# Patient Record
Sex: Male | Born: 1935 | Race: White | Hispanic: No | Marital: Married | Smoking: Never smoker
Health system: Southern US, Community
[De-identification: ages and names within clinical notes are randomized; demographics above are authoritative.]

## PROBLEM LIST (undated history)

## (undated) DIAGNOSIS — I472 Ventricular tachycardia: Secondary | ICD-10-CM

## (undated) DIAGNOSIS — I441 Atrioventricular block, second degree: Principal | ICD-10-CM

## (undated) DIAGNOSIS — I440 Atrioventricular block, first degree: Secondary | ICD-10-CM

## (undated) DIAGNOSIS — Z950 Presence of cardiac pacemaker: Secondary | ICD-10-CM

## (undated) DIAGNOSIS — J45909 Unspecified asthma, uncomplicated: Secondary | ICD-10-CM

## (undated) HISTORY — DX: Atrioventricular block, second degree: I44.1

## (undated) HISTORY — DX: Ventricular tachycardia: I47.2

## (undated) HISTORY — DX: Unspecified asthma, uncomplicated: J45.909

## (undated) HISTORY — DX: Presence of cardiac pacemaker: Z95.0

## (undated) HISTORY — DX: Atrioventricular block, first degree: I44.0

---

## 1998-08-20 ENCOUNTER — Ambulatory Visit (HOSPITAL_COMMUNITY): Admission: RE | Admit: 1998-08-20 | Discharge: 1998-08-20 | Payer: Self-pay | Admitting: Gastroenterology

## 2002-01-24 ENCOUNTER — Ambulatory Visit (HOSPITAL_COMMUNITY): Admission: RE | Admit: 2002-01-24 | Discharge: 2002-01-24 | Payer: Self-pay | Admitting: Gastroenterology

## 2008-02-28 ENCOUNTER — Inpatient Hospital Stay (HOSPITAL_COMMUNITY): Admission: EM | Admit: 2008-02-28 | Discharge: 2008-03-01 | Payer: Self-pay | Admitting: Emergency Medicine

## 2008-03-26 ENCOUNTER — Ambulatory Visit (HOSPITAL_COMMUNITY): Admission: RE | Admit: 2008-03-26 | Discharge: 2008-03-26 | Payer: Self-pay | Admitting: Gastroenterology

## 2008-04-14 ENCOUNTER — Inpatient Hospital Stay (HOSPITAL_COMMUNITY): Admission: RE | Admit: 2008-04-14 | Discharge: 2008-04-16 | Payer: Self-pay | Admitting: General Surgery

## 2011-04-18 NOTE — H&P (Signed)
[name], [name]              ACCOUNT NO.:  [id number]   MEDICAL RECORD NO.:  [id number]          PATIENT TYPE:  INP   LOCATION:  NA                           FACILITY:  [facility]   PHYSICIAN:  Sharlet Salina T. Hoxworth, M.D.DATE OF BIRTH:  09/30/1931   DATE OF ADMISSION:  04/14/2008  DATE OF DISCHARGE:                              HISTORY & PHYSICAL   CHIEF COMPLAINT:  Abdominal pain, large hiatal hernia.   HISTORY OF PRESENT ILLNESS:  Mr. Brian Barron is a 77 year old male with a  long history of a known large hiatal hernia.  Over the years, this has  bothered him minimally with occasional episodes of epigastric and chest  pain after eating.  However, he presented approximately six weeks ago  with severe epigastric low substernal pain, nausea and vomiting, and was  found to have organoaxial rotation of a giant hiatal hernia with the  majority of the stomach in the chest.  There was gastric obstruction  which was relieved with an NG tube.  The patient was stabilized and we  recommended proceeding with laparoscopic repair of his hernia.  He has  some degree of reflux, as well, and we will plan a Nissen  fundoplication.  He has undergone a preoperative esophageal manometry  showing normal peristalsis.  The patient is now admitted for this  procedure.   PAST MEDICAL HISTORY:  As above plus AVMs seen on colonoscopy.  Other  than that, he does not have any severe medical or surgical problems or  hospitalizations.   MEDICATIONS:  Protonix, aspirin.   ALLERGIES:  None.   SOCIAL HISTORY:  Married.  Drinks occasional alcohol.  He continues to  work part-time. Nonsmoker   FAMILY HISTORY:  Noncontributory.   REVIEW OF SYSTEMS:  GENERAL:  No fever or chills, weight stable.  RESPIRATORY:  No shortness breath, cough, or wheezing.  CARDIAC: No  chest pain, palpitations, or history of heart disease.  ABDOMEN/GI:  As  above.   PHYSICAL EXAMINATION:  VITAL SIGNS:  The patient is afebrile, heart  rate  71, respirations 14, blood pressure to 140/75.  GENERAL:  Well developed, fairly fit appearing male in no acute  distress.  SKIN:  Warm and dry with no rash or infection.  HEENT: No palpable masses or thyromegaly.  Sclerae nonicteric.  Oropharynx clear.  LUNGS:  Clear without wheezing or increased work of breathing.  Minimal  kyphosis.  CARDIAC:  Regular rhythm.  No murmurs.  No edema.  No JVD.  ABDOMEN: Soft, nontender.  No palpable masses.  No organomegaly.  EXTREMITIES: No joint swelling or deformity.  NEUROLOGIC:  Alert and oriented, gait normal.   ASSESSMENT/PLAN:  Large hiatal hernia containing most of the stomach  with previous episode of obstruction.  The patient is admitted for  laparoscopic repair and Nissen fundoplication.      Lorne Skeens. Hoxworth, M.D.  Electronically Signed     BTH/MEDQ  D:  04/14/2008  T:  04/14/2008  Job:  [id number]

## 2011-04-18 NOTE — Discharge Summary (Signed)
NAMEMARTE, CELANI NO.:  [id number]   MEDICAL RECORD NO.:  [id number]          PATIENT TYPE:  INP   LOCATION:  1417                         FACILITY:  [facility]   PHYSICIAN:  Ramiro Harvest, MD    DATE OF BIRTH:  1931/07/03   DATE OF ADMISSION:  02/27/2008  DATE OF DISCHARGE:  03/01/2008                               DISCHARGE SUMMARY   PRIMARY CARE PHYSICIAN:  Dr. Dorothe Pea of [facility] physicians.   GASTROENTEROLOGIST:  Dr. Sherin Quarry.   DISCHARGE DIAGNOSIS:  1. Large paraesophageal hernia and anterothoracic stomach with episode      of gastric outlet obstruction.  2. History of hiatal hernia.  3. Dehydration.  4. Hyperkalemia.  5. First-degree AV block.  6. Status post tooth extraction.  7. Colonic diverticulosis.   DISCHARGE MEDICATIONS:  1. Protonix 40 mg p.o. daily.  2. Amoxicillin 875 mg p.o. b.i.d. take as previously described x3      days.  3. Benadryl 25 mg q.h.s. p.r.n.  4. Aspirin 81 mg p.o. daily.  5. Gas-X as needed.   DISPOSITION AND FOLLOWUP:  The patient will be discharged home a on a  soft mechanical diet.  The patient is to follow up with Dr. Johna Sheriff.  The patient will be called with a follow-up appointment.  The patient is  also to schedule a follow-up appointment with his primary care  physician, Dr. Dorothe Pea in 2 weeks. On follow-up a basic metabolic  profile needs to be checked to follow up on the patient's electrolytes  and also may need to follow up on the patient's first degree AV block.  PCP will likely need to also follow up on the patient's first degree AV  block and will stay away from placing the patient on any beta blockers,  calcium channel blockers or clonidine.   PROCEDURES PERFORMED:  An abdominal ultrasound was performed on February 27, 2008 which showed no acute intra-abdominal findings by ultrasound.  CT of the abdomen was done on February 28, 2008 which showed a little  contrast noted within the distal stomach and small  bowel which is  compatible with some degree of obstruction of the distal stomach at the  diaphragmatic hiatus, very large hiatal hernia, mild bilateral lower  lung ground glass opacities suggestive of mild inflammation or  infection.  No acute intra-abdominal process.  CT of the pelvis.  No  evidence of acute pelvic abnormality, colonic diverticulosis without  evidence of diverticulitis.   CONSULTATIONS:  A GI consult was done. The patient was seen in  consultation by Dr. Evette Cristal of [facility] gastroenterologist on February 28, 2008.  The patient was also seen by Dr. Johna Sheriff of [facility] surgery on  February 28 2008.   BRIEF ADMISSION HISTORY AND PHYSICAL:  Mr. Madole is a 77 year old  gentleman with a history of hiatal hernia who presented to the ED on  March 26 with acute onset of nausea and vomiting after eating lunch. The  patient had had several episodes in the past which he attributed to his  hiatal hernia.  The patient usually states that he had to take  a little  bit of Gas-X after initial symptoms,  sometimes he has to vomit and this  improves his diarrhea.  The patient had a CT scan of the abdomen and  pelvis obtained that showed a hiatal hernia, pyloric sphincter with a  very poor flow of contrast into the small bowel, most likely consistent  with obstruction. The right upper quadrant was normal after placement of  NG tube, 2 liters of dark brown foul-smelling fluid was obtained,  symptoms quickly resolved.  The patient was admitted to the [facility]  hospitalist for __________ treatment. Of note, the patient had had a  recent tooth extraction for which he was started on amoxicillin but was  not able to complete his course secondary to his nausea and vomiting.   PHYSICAL EXAM PER ADMITTING PHYSICIAN:  Blood pressure 133/86,  respirations 20, pulse 89, temperature 98.0, satting 95% on room air.  GENERAL:  The patient was in no acute distress.  NG tube was in place.  HEENT:  Normocephalic, atraumatic.  Pupils equal, round and reactive to  light.  Extraocular movements intact.  Oropharynx was dry clear, no  exudates.  No lesions and decreased skin turgor.  LUNGS:  Clear to auscultation bilaterally, but somewhat distant breath  sounds.  ABDOMEN:  Nontender, nondistended, positive bowel sounds.  However,  prior to admitting physician examining the patient per ED records, the  patient's abdomen was tender.  EXTREMITIES:  No clubbing, cyanosis or edema.  NEUROLOGICAL:  Intact.   ADMISSION LABS:  White count 9.5, hemoglobin 16.4, platelets 167, sodium  146, potassium 4.6, chloride 104, BUN 19, creatinine 1.32, glucose of  145.  LFTs were normal.  Lipase 35 and cardiac enzymes were negative.   EKG showed a junctional rhythm.  CT of the abdomen and pelvis showed a  hiatus hernia with obstruction. Ultrasound within normal limits.   HOSPITAL COURSE:  1. Large paraesophageal hernia and a intrathoracic stomach with      episode of gastric outlet obstruction.  The patient had been placed      on the NG tube with improvement in his clinical symptoms.  The      patient had no more nausea or vomiting.  The patient was made      n.p.o. The patient was started on Protonix 40 mg IV daily. A      gastroenterology consult was obtained.  The patient was seen in      consultation per Dr. Evette Cristal on February 28, 2008. Dr. Evette Cristal reviewed      the CT scan with the radiologist and they felt the patient had a      very large and anterothoracic stomach and a paraesophageal hernia      as opposed to a sliding hernia and that paraesophageal hernia was      mostly in the chest and almost all of the patient's stomach was      above the diaphragm. Also mentioned that the patient did have a      lipoma in the second portion of his duodenum. It was felt that the      patient would not benefit from an upper endoscopy, a surgical      consult was thus obtained.  The patient was seen in  consultation      per Dr. Johna Sheriff of [facility] surgery.  The patient had      symptomatically improved and the NG tube was removed and the      patient was  placed on a full liquid diet.  The patient tolerated      his full liquid diet, had no more nausea or vomiting during the      hospitalization.  It was felt that the patient did need to have a      laparoscopic repair preferably and this will need to be scheduled      electively. It was felt that the patient was in stable and improved      condition to be discharged home and the patient will be called by      Dr. Jamse Mead office for further follow-up and scheduling of      elective repair of his hernia.  The patient will be discharged home      in stable and improved condition.  2. Hyperkalemia.  The patient had come in initially with a normal      potassium during the hospitalization, it was noted that the      patient's potassium had gone up to 5.6.  The patient was given a      dose of Kayexalate with resolution of his hyperkalemia.  The      patient did not have any EKG findings of any peaked T-waves. The      patient was in stable and improved condition to be discharged home.  3. Dehydration. It was noted that the patient was dehydrated. The      patient was placed on IV fluids and the patient was well-hydrated.      The patient will be discharged in stable and improved condition.  4. First-degree AV block.  This was noted per tele strip. An EKG was      also obtained.  The patient did indeed have a first-degree AV block      with a PR interval of 378.  The patient was asymptomatic and at      this will need to be a followed up per PCP. Will stay away from any      beta blockers or clonidine in the future secondary to his history      of first degree AV block.  5. Status post tooth extraction. The patient just had a tooth      extraction prior to his hospitalization. He was sent home on      amoxicillin but secondary to  his nausea and vomiting was unable to      take it during the hospitalization. The patient was maintained on      clindamycin IV for this and will be discharged home to finish up      his course of amoxicillin. The rest of the patient's medical issues      were stable throughout the hospitalization. The patient will be      discharged in stable and improved condition. On the day of      discharge vital signs, temperature 98.1, pulse of 73, blood      pressure 150/84, respiratory rate 20, satting 95% on room air.      BMET:  Sodium 141, potassium 3.9, chloride one 111, bicarb 26, BUN      7, creatinine 0.97, glucose of 96 and a calcium of 8.1.  It was a      pleasure taking care of Mr. Aseem Sessums.      Ramiro Harvest, MD  Electronically Signed     DT/MEDQ  D:  03/01/2008  T:  03/02/2008  Job:  [id number]   cc:   Molly Maduro  Kristeen Mans, M.D.  Fax: [phone]   Tasia Catchings, M.D.  Fax: [phone]   Graylin Shiver, M.D.  Fax: [phone]   Lorne Skeens. Hoxworth, M.D.  [address], [address]  [place]  [place] [address]

## 2011-04-18 NOTE — Consult Note (Signed)
[name], [name]              ACCOUNT NO.:  [id number]   MEDICAL RECORD NO.:  [id number]          PATIENT TYPE:  INP   LOCATION:  1417                         FACILITY:  [facility]   PHYSICIAN:  Sharlet Salina T. Hoxworth, M.D.DATE OF BIRTH:  [birth year]   DATE OF CONSULTATION:  02/28/2008  DATE OF DISCHARGE:                                 CONSULTATION   CHIEF COMPLAINT:  Abdominal pain, hiatal hernia.   HISTORY OF PRESENT ILLNESS:  Brian Barron is a very pleasant 77 year old  male.  I was asked by Dr. Evette Cristal to evaluate the patient due to a large  hiatal hernia and an episode of abdominal pain.  Mr. Dygert is  followed regularly by Dr. Sherin Quarry and has apparently had a hiatal  hernia noted for some time.  He states that over about the past year he  has had some episodes of abdominal and chest pain after eating.  He  describes a pressure like discomfort in his low substernal area and  upper epigastrium after eating.  It is often after he eats more rapidly  or larger amounts.  He will developed this discomfort and mild pain  which is then fairly promptly relieved by belching or taking Gas-X.  This has not been a really significant problem for him up until this  point.  However, on the day prior to admission after eating lunch he  again developed similar symptoms but this time instead of improving  quickly after Gas-X the pain intensified and became very severe with  what he describes as 10/10 pressure and aching pain in his epigastrium.  This was then associated with vomiting which was described as dark,  possibly old bloody type vomitus.  The patient presented to the  emergency room.  An NG tube was placed with a large amount of return and  very prompt resolution of his symptoms.   CT scan has been obtained showing a large hiatal hernia as described  below.   PAST MEDICAL HISTORY:  The patient is followed by Dr. Sherin Quarry for  hiatal hernia and also has had a colonoscopy with apparent  AVMs which  has not been a major problem for him.  Other than this he denies any  significant medical or surgical problems or hospitalizations.   MEDICATIONS:  Aspirin.   ALLERGIES:  None.   SOCIAL HISTORY:  Significant for alcohol, married, continues to work  part-time.   FAMILY HISTORY:  Noncontributory.   REVIEW OF SYSTEMS:  GENERAL:  No fever or chills.  RESPIRATORY:  Denies  shortness of breath, cough, wheezing.  CARDIAC:  Denies chest pain or  palpitations, history of heart disease.  ABDOMEN:  GI as above.   PHYSICAL EXAMINATION:  VITAL SIGNS:  Currently afebrile with vital signs  all within normal limits.  GENERAL:  He is an alert well-developed male in no distress.  SKIN:  Warm and dry.  No rash or infection.  HEENT:  No masses or thyromegaly.  Sclerae nonicteric.  LUNGS:  Clear without wheezing or increased work of breathing.  CARDIAC:  Regular rate and rhythm.  No murmurs.  No edema.  Peripheral  pulses intact.  ABDOMEN:  NG tube is in place.  The abdomen is flat, soft, nontender.  No masses.  EXTREMITIES:  No joint swelling or deformity.  NEUROLOGIC:  Alert, oriented.  Motor and sensory exams normal.   LABORATORY:  White count is 10.4, hemoglobin 14.2, platelets 141.  Electrolytes - BUN and creatinine all normal.  INR 1.0.  LFTs normal.  Troponin and CK/MBs negative.   IMAGING:  Abdominal ultrasound is negative.  CT scan of the abdomen and  pelvis is reviewed.  This shows a very large hiatal hernia with a  majority of the stomach in the chest.  There is a portion of the mid  body of the stomach that is below the diaphragm in the abdominal cavity  but the distal stomach, pylorus and proximal duodenum are above the  diaphragm in the hernia.  There is poor emptying of contrast from the  stomach.   ASSESSMENT AND PLAN:  Large hiatal hernia with an episode of gastric  obstruction.  This appears to have been relieved by placement of NG tube  and the patient now is  stable and asymptomatic.   I believe this hernia needs to be repaired.  He obviously developed some  folding or torsion of his stomach resulting in severe pain and gastric  outlet obstruction and is at risk for continued problems.   The patient's current episode is resolved.  I suspect we will be able to  remove the NG tube and resume his diet.  Unless he has ongoing symptoms  in the hospital in order to have adequate OR time and assistance I would  recommend discharging the patient home if he is tolerating his diet and  we will plan to schedule him for laparoscopic repair of his hiatal  hernia in the near future.  Will follow with you.      Lorne Skeens. Hoxworth, M.D.  Electronically Signed     BTH/MEDQ  D:  02/29/2008  T:  03/01/2008  Job:  [id number]   cc:   Tasia Catchings, M.D.  Fax: [phone]

## 2011-04-18 NOTE — Consult Note (Signed)
NAMESHANE, Brian Barron NO.:  [id number]   MEDICAL RECORD NO.:  [id number]          PATIENT TYPE:  INP   LOCATION:  1417                         FACILITY:  [facility]   PHYSICIAN:  Graylin Shiver, M.D.   DATE OF BIRTH:  1931-09-18   DATE OF CONSULTATION:  02/28/2008  DATE OF DISCHARGE:                                 CONSULTATION   We were asked to see Brian Barron today in consultation for possible  gastric obstruction by Dr. Ramiro Harvest.   HISTORY OF PRESENT ILLNESS:  This is a 77 year old male who is a patient  of Dr. Boyd Kerbs who has a known large hiatal hernia that has  intermittently caused him pain after eating.  It usually happens once  every six months or so.  The patient says that he just takes an Ex-Lax,  waits about an hour, and the pain is gone.  Yesterday, however, after  lunch the familiar pain started again, but intensified and the patient  began to vomit.  When the pain got so bad that he could no longer stand  it he came to the [facility] emergency department, and the pain was  relieved when an NG tube was placed.   PAST MEDICAL HISTORY:  Significant for large hiatal hernia, GI bleed  secondary to AVM's, and history of anemia.  Dr. Sherin Quarry did his last  endoscopy on the patient 2-3 years ago.  His last colonoscopy was also  done approximately 2-3 years ago.  His next colonoscopy is in 10 years.  I have requested those records from his office.   CURRENT MEDICATIONS:  1. An 81 mg aspirin.  2. Gas-X.  3. Benadryl.  4. Amoxicillin.   ALLERGIES:  He has no known drug allergies.   REVIEW OF SYSTEMS:  Significant for weight loss of about 5 pounds over  the past year.   SOCIAL HISTORY:  Negative for tobacco, negative for alcohol.  He  exercises regularly and lives at home with his wife.   PHYSICAL EXAMINATION:  GENERAL:  He is alert and oriented.  His NG tube  is in place.  It is not draining any liquids at this time.  VITAL SIGNS:   Temperature is 98.5, pulse 77, respirations 20, blood  pressure 118/63.  CARDIOVASCULAR:  Has a regular rate and rhythm with no murmurs, rubs or  gallops.  Reportedly he had some arrhythmias this morning.  LUNGS:  Clear to auscultation bilaterally.  ABDOMEN:  Now soft, nontender, nondistended with good bowel sounds.   CURRENT LABORATORIES:  Show a potassium of 5.6.  However, since this lab  was taken the patient has had a Kayexalate enema.  BUN is 18, creatinine  1.1.  Coags show PT 13.8, PTT 24.  White count 10.4, hemoglobin 14.2,  hematocrit 41.6, platelets 141,000.   Abdominal ultrasound done yesterday that was negative.  He had a CT scan  of his abdomen and pelvis done early this morning at approximately 1  a.m. that showed:   1. Some degree of gastric obstruction at the diaphragm.  2. Very large hiatal hernia.  3.  Mild bilateral lower lung inflammation versus infection.  I spoke      with Dr. Dahlia Client of radiology who told me that the patient did have      a very large anterothoraciic stomach and paraesophageal hernia as      opposed to a sliding hiatal hernia, but the paraesophageal hernia      is mostly in the chest and almost all of the patient's stomach is      above the diaphragm.  He also mentioned that he had a big lipoma in      the second portion of his duodenum.   ASSESSMENT:  Dr. Wandalee Ferdinand has seen and examined the patient, collected  a history, and reviewed the chart.  His impression is that this is a 89-  year-old gentleman with a large paraesophageal hernia causing an  intrathoracic stomach causing partial gastric obstruction.   PLAN:  After having detailed conversations with the family it was  determined that an upper endoscopy would probably not be helpful at this  point.  We will call a surgical consult and add Protonix to his current  medications.  I would recommend that he be discharged on Protonix as an  outpatient as well.   Thanks very much for this  consultation.      Stephani Police, PA    ______________________________  Graylin Shiver, M.D.    MLY/MEDQ  D:  02/28/2008  T:  02/29/2008  Job:  [id number]   cc:   Tasia Catchings, M.D.  Fax: [phone]   Graylin Shiver, M.D.  Fax: [phone]

## 2011-04-18 NOTE — Op Note (Signed)
NAMESALOMON, GANSER              ACCOUNT NO.:  [id number]   MEDICAL RECORD NO.:  [id number]          PATIENT TYPE:  AMB   LOCATION:  ENDO                         FACILITY:  [facility]   PHYSICIAN:  Tasia Catchings, M.D.   DATE OF BIRTH:  1931-08-12   DATE OF PROCEDURE:  03/26/2008  DATE OF DISCHARGE:  03/26/2008                               OPERATIVE REPORT   INDICATION:  Mr. Spray has a significant hiatal hernia and needs to  repair and in order to provide him with a fundoplication, we needed to  know whether his esophageal contractions were normal.   FINDINGS:  Upper esophageal body amplitude was 38 mmHg at resting and it  relaxed properly with swallowing and on time.   Lower esophageal sphincter resting pressure was 38.1 mm and it also  relaxed normally with swallowing and on time.   ESOPHAGEAL BODY:  A 20 wet swallows were obtained and the pressures were  measured at various levels.  All of the swallows produced peristaltic  waves.  There were no repetitive retrograde, spontaneous or simultaneous  contractions noted.   FINAL IMPRESSION:  This was a normal esophageal manometry without  evidence of achalasia or diffuse esophageal spasm.  It is my opinion  that it is safe to perform a fundoplication in this patient.      Tasia Catchings, M.D.  Electronically Signed     JW/MEDQ  D:  04/02/2008  T:  04/03/2008  Job:  [id number]   cc:   Lorne Skeens. Hoxworth, M.D.

## 2011-04-18 NOTE — Op Note (Signed)
NAMEELIZEO, RODRIQUES              ACCOUNT NO.:  [id number]   MEDICAL RECORD NO.:  [id number]          PATIENT TYPE:  INP   LOCATION:  NA                           FACILITY:  [facility]   PHYSICIAN:  Sharlet Salina T. Hoxworth, M.D.DATE OF BIRTH:  1931/02/06   DATE OF PROCEDURE:  04/14/2008  DATE OF DISCHARGE:                               OPERATIVE REPORT   PREOPERATIVE DIAGNOSIS:  Giant hiatal hernia and gastroesophageal  reflux.   POSTOPERATIVE DIAGNOSIS:  Giant hiatal hernia and gastroesophageal  reflux.   SURGICAL PROCEDURES:  Laparoscopic repair of giant hiatal hernia with  Nissen fundoplication.   SURGEON:  Lorne Skeens. Hoxworth, M.D.   ASSISTANT:  Thornton Park. Daphine Deutscher, M.D.   ANESTHESIA:  General.   BRIEF HISTORY:  Mr. Cuff is a 77 year old male with a known hiatal  hernia who has had mild episodes of abdominal and chest pain for some  time.  However, he recently presented with severe epigastric and post  sternal chest pain, nausea and vomiting.  He presented and was found to  have a gastric obstruction with a large hiatal hernia with essentially  the entire stomach in the chest with organoaxial rotation and  obstruction.  This was relieved with NG suction.  After further workup  and discussion, we have elected to repair his large hiatal hernia.  We  will plan Nissen fundoplication, as well, due to some degree of reflux.  The nature of the procedure, indications, risks of bleeding, infection,  cardiorespiratory complications, esophageal gastric injury, recurrent  hernia, dysphagia, gas bloat, and others were discussed and understood.  He is now brought to the operating room for this procedure.   DESCRIPTION OF OPERATION:  The patient was brought to the operating room  and placed in the supine position on the operating table and general  endotracheal anesthesia was induced.  The abdomen was widely sterilely  prepped and draped.  PAS were in place.  He received preoperative  antibiotics.  The correct patient and procedure were verified.  Access  was obtained without difficulty with an 11 mm OptiVu trocar in the left  subcostal space and pneumoperitoneum established.  Under direct vision,  an 11 mm trocar was placed in the extreme right upper quadrant, in the  right mid abdomen, just above to the left of the umbilicus for the  camera port.  Through a 5 mm site, a Nathanson retractor was placed and  the left lobe of the liver elevated with excellent exposure of the  stomach and the hiatus.  Finally, a 5 mm trocar was placed in the left  flank.  There was a very large hiatal hernia present, probably 8-10 cm  in diameter, with the majority of the stomach lying in the mediastinum.  The stomach was retracted inferiorly and then the hernia sac was  incised, initially along the right crus and extending up over the  anterior diaphragmatic defect, working back down along the left crus.  The hernia sac was completely stripped down out of the mediastinum.  The  short gastric vessels at the fundus were identified and 2-3 of these  were  divided with clips and harmonic scalpel freeing the fundus and  further exposing the base of the left crus which was dissected and the  stomach freed from this and further hernia sac stripped down  posteriorly.  The vagus nerves were identified, the esophagus was  identified, and all protected.  The hernia sac was brought completely  down out of the mediastinum and the crura were cleared from the base all  the way up anteriorly and completely defined.  At this point, a Penrose  drain was placed posterior to the esophagus at the EG junction.  I was  able to elevate this and the esophagus was further mobilized somewhat  out of the mediastinum with good length until the EG junction lay  without any tension several cm into the abdominal cavity.  At this  point, a crural repair was done beginning posteriorly with 2-0 Ethibond  pledgeted sutures  working anteriorly until the diaphragm was closed  loosely around the esophagus.  This was done with minimal tension.   At this point, a Surgisis hiatal biologic patch was used and introduced  in the abdomen, oriented, and used as an onlay over the crural repair  with the tails up around the esophagus on either side.  This was sutured  to the diaphragm with a number of 3-0 Vicryl sutures on an SH needle.  Further fixation of the biologic patch was then performed with Tisseel  tissue sealant.  This provided a nice broad coverage around the  diaphragmatic repair.  At this point, a mobile portion of the fundus was  identified and brought posterior to the esophagus and a 360 degrees wrap  was planned with a contiguous piece of fundus.  The lighted 56 bougie  dilator was then passed into the stomach without difficulty.  A 360  degrees wrap was performed for about 5 cm around the bougie dilator to  the esophagus.  The dilator was then removed and the wrap was seen to be  nice and floppy.  The abdomen was carefully inspected for hemostasis and  there was no evidence of bleeding or visceral injury.  The Nathanson  retractor was removed, all CO2 evacuated, and trocars removed.  The skin  incisions were closed with staples.  Sponge and needle counts were  correct.  The patient was taken to recovery in good position.      Lorne Skeens. Hoxworth, M.D.  Electronically Signed     BTH/MEDQ  D:  04/14/2008  T:  04/14/2008  Job:  [id number]

## 2011-04-18 NOTE — H&P (Signed)
Brian Barron, Brian Barron              ACCOUNT NO.:  [id number]   MEDICAL RECORD NO.:  [id number]          PATIENT TYPE:  INP   LOCATION:  0102                         [facility]ACILITY[facility]   PHYSICIAN:  Michiel Cowboy, MDDATE OF BIRTH:  1931-10-23   DATE OF ADMISSION:  02/27/2008  DATE OF DISCHARGE:                              HISTORY & PHYSICAL   PRIMARY CARE PHYSICIAN:  Jethro Bastos, M.D.  GI, Dr. Sherin Quarry.   CHIEF COMPLAINT:  Nausea and vomiting.   HISTORY OF PRESENT ILLNESS:  This is a 77 year old gentleman with  history of hiatal hernia who presented to the emergency department on  March 26, with acute-onset of nausea and vomiting since eating lunch.  The patient has had several episodes in the past which he attributes to  hiatal hernia.  He usually states that he has to take a little bit of  Gas-X.  After initial symptoms, sometimes he has to vomit and this  improves his nausea.  The patient had a CT scan of abdomen and pelvis  obtained that showed a hiatal hernia.  The pyloric sphincter with very  poor flow of contrast into small bowel most likely consistent of  obstruction.  Right upper quadrant was normal.  After placement of NG  tube, 2 L of dark brown, foul smelling fluid was obtained.  Symptoms  quickly resolved.  The patient is being admitted by [facility] hospitalists  for observation and treatment.  Of note, the patient has had a recent  tooth extraction for which he was started on amoxicillin, but was not  able to complete his course secondary to his nausea and vomiting.   ALLERGIES:  No known drug allergies.   MEDICATIONS:  Aspirin.   PAST MEDICAL HISTORY:  Hiatal hernia, otherwise the patient is unsure of  any other medical problems that he has.   SOCIAL HISTORY:  Denies every smoking or drinking.  Exercises regularly.  He lives with wife.   FAMILY HISTORY:  Noncontributory.   REVIEW OF SYSTEMS:  Significant for weight loss of over 5 pounds  unintentional.   Significant for no urine output since lunch.  Otherwise,  unremarkable.   PHYSICAL EXAMINATION:  VITAL SIGNS:  Blood pressure 133/86, respirations  20, heart rate 89, temperature 98, saturations 95% on room air.  GENERAL:  The patient is in no acute distress.  NG tube in place.  HEENT:  Head atraumatic.  Somewhat dry mucous membranes with decreased  skin turgor.  LUNGS:  Clear to auscultation bilaterally, but somewhat distant breath  sounds.  ABDOMEN:  Nontender, nondistended.  After review of ER records, he was  previously very tender.  EXTREMITIES:  Lower extremities without any edema.  NEUROLOGIC:  Intact.   LABORATORY DATA AND X-RAY FINDINGS:  White blood cell count 9.5,  hemoglobin 16.4, platelets 167.  Sodium 146, potassium 4.6, chloride  104, BUN 19, creatinine 1.32, glucose 145.  LFTs are normal.  Lipase 35.  Cardiac enzymes negative.   EKG showing a junctional rhythm.  CT of abdomen and pelvis showing  hiatal hernia with obstruction.  Ultrasound within normal limits.  ASSESSMENT/PLAN:  This is a 77 year old gentleman with hiatal hernia and  obstruction.   1. Gastric obstruction.  Will make the patient n.p.o.  Nasogastric      tube to intermittent suction.  Gastroenterology consult in the      morning.  Protonix intravenous for prophylaxis.  Zofran as needed      for nausea.  2. Dehydration.  Will give intravenous fluids.  3. Junctional rhythm.  Will watch telemetry.  4. Prophylaxis with Protonix and SCDs.      Michiel Cowboy, MD  Electronically Signed     AVD/MEDQ  D:  02/28/2008  T:  02/28/2008  Job:  [id number]   cc:   Jethro Bastos, M.D.  Fax: [phone]   Tasia Catchings, M.D.  Fax: [phone]

## 2011-08-28 LAB — COMPREHENSIVE METABOLIC PANEL
ALT: 19
Albumin: 3.1 — ABNORMAL LOW
Alkaline Phosphatase: 80
BUN: 18
BUN: 19
CO2: 30
Calcium: 10
Calcium: 9.2
Creatinine, Ser: 1.1
GFR calc non Af Amer: 53 — ABNORMAL LOW
Glucose, Bld: 145 — ABNORMAL HIGH
Sodium: 142
Total Protein: 5.6 — ABNORMAL LOW

## 2011-08-28 LAB — CBC
HCT: 41.6
HCT: 47.8
Hemoglobin: 14
Hemoglobin: 16.4
MCHC: 34.4
MCHC: 35.1
MCV: 94.4
Platelets: 141 — ABNORMAL LOW
RBC: 4.27
RBC: 5.1
RDW: 13.5
WBC: 7

## 2011-08-28 LAB — BASIC METABOLIC PANEL
CO2: 24
CO2: 26
Calcium: 8.2 — ABNORMAL LOW
Chloride: 107
Chloride: 111
Creatinine, Ser: 0.94
GFR calc Af Amer: >60
GFR calc Af Amer: >60
GFR calc non Af Amer: >60
Glucose, Bld: 96
Potassium: 3.9
Sodium: 137
Sodium: 141

## 2011-08-28 LAB — DIFFERENTIAL
Basophils Absolute: 0
Basophils Relative: 1
Eosinophils Absolute: 0.1
Lymphocytes Relative: 6 — ABNORMAL LOW
Lymphs Abs: 1.8
Monocytes Absolute: 0.8
Monocytes Relative: 8
Neutro Abs: 6.7
Neutro Abs: 9 — ABNORMAL HIGH
Neutrophils Relative %: 71

## 2011-08-28 LAB — LIPASE, BLOOD: Lipase: 35

## 2011-08-28 LAB — APTT: aPTT: 24

## 2011-08-28 LAB — CK TOTAL AND CKMB (NOT AT ARMC): Relative Index: INVALID

## 2013-01-09 ENCOUNTER — Ambulatory Visit (INDEPENDENT_AMBULATORY_CARE_PROVIDER_SITE_OTHER): Payer: Medicare Other | Admitting: Internal Medicine

## 2013-01-09 ENCOUNTER — Encounter: Payer: Self-pay | Admitting: Internal Medicine

## 2013-01-09 VITALS — BP 122/76 | HR 74 | Temp 98.0°F | Ht 70.0 in | Wt 185.0 lb

## 2013-01-09 DIAGNOSIS — J45909 Unspecified asthma, uncomplicated: Secondary | ICD-10-CM

## 2013-01-09 MED ORDER — BUDESONIDE-FORMOTEROL FUMARATE 80-4.5 MCG/ACT IN AERO: 2.0000 | INHALATION_SPRAY | Freq: Two times a day (BID) | RESPIRATORY_TRACT | Status: DC

## 2013-01-09 NOTE — Patient Instructions (Addendum)
Symbicort 80 Take 2 puffs first thing in am and then another 2 puffs about 12 hours later.   Only use your albuterol (proaire) as a rescue medication to be used if you can't catch your breath by resting or doing a relaxed purse lip breathing pattern. The less you use it, the better it will work when you need it.  Goal is less than twice a week  Work on inhaler technique:  relax and gently blow all the way out then take a nice smooth deep breath back in, triggering the inhaler at same time you start breathing in.  Hold for up to 5 seconds if you can.  Rinse and gargle with water when done   If your mouth or throat starts to bother you,   I suggest you time the inhaler to your dental care and after using the inhaler(s) brush teeth and tongue with a baking soda containing toothpaste and when you rinse this out, gargle with it first to see if this helps your mouth and throat.     Please schedule a follow up office visit in 6 weeks, call sooner if needed with pft's

## 2013-01-09 NOTE — Progress Notes (Signed)
  Subjective:    Patient ID: Brian Barron, male    DOB: 30-Jan-1931  MRN: [id number]  HPI  79 yowm never smoker with dx of asthma off and on since around age 79 eval by Gene Dooms and dx EIA referred 01/10/2013 to Pulmonary clinic by Dr Marrow for sob.     01/09/2013 1st pulmonary cc new onset am sob x one year indolent onset persistent daily sense of sob and mild wheeze each aam better after using albuterol and using avg twice daily= usually at bedtime and usually first thing in am.  Min assoc dry cough.  No obvious daytime variabilty or assoc excess mucous production cough or cp or chest tightness, subjective wheeze overt sinus or hb symptoms. No unusual exp hx or h/o childhood pna/ asthma or premature birth to his knowledge.   After noct saba >Sleeping ok without nocturnal flare  of respiratory  c/o's or need for noct saba. Also denies any obvious fluctuation of symptoms with weather or environmental changes or other aggravating or alleviating factors except as outlined above       Review of Systems  Constitutional: Negative for fever and unexpected weight change.  HENT: Negative for ear pain, nosebleeds, congestion, sore throat, rhinorrhea, sneezing, trouble swallowing, dental problem, postnasal drip and sinus pressure.   Eyes: Negative for redness and itching.  Respiratory: Positive for cough, shortness of breath and wheezing. Negative for chest tightness.   Cardiovascular: Negative for palpitations and leg swelling.  Gastrointestinal: Negative for nausea and vomiting.  Genitourinary: Negative for dysuria.  Musculoskeletal: Negative for joint swelling.  Skin: Negative for rash.  Neurological: Negative for headaches.  Hematological: Does not bruise/bleed easily.  Psychiatric/Behavioral: Positive for dysphoric mood. The patient is nervous/anxious.        Objective:   Physical Exam Wt Readings from Last 3 Encounters:  01/09/13 185 lb (83.915 kg)    HEENT: nl dentition,  turbinates, and orophanx. Nl external ear canals without cough reflex   NECK :  without JVD/Nodes/TM/ nl carotid upstrokes bilaterally   LUNGS: no acc muscle use, clear to A and P bilaterally without cough on insp or exp maneuvers   CV:  RRR  no s3 or murmur or increase in P2, no edema   ABD:  soft and nontender with nl excursion in the supine position. No bruits or organomegaly, bowel sounds nl  MS:  warm without deformities, calf tenderness, cyanosis or clubbing  SKIN: warm and dry without lesions    NEURO:  alert, approp, no deficits         Assessment & Plan:

## 2013-01-10 DIAGNOSIS — J45909 Unspecified asthma, uncomplicated: Secondary | ICD-10-CM | POA: Insufficient documentation

## 2013-01-10 NOTE — Assessment & Plan Note (Addendum)
Not Adequate control on present rx, reviewed:  DDX of  difficult airways managment all start with A and  include Adherence, Ace Inhibitors, Acid Reflux, Active Sinus Disease, Alpha 1 Antitripsin deficiency, Anxiety masquerading as Airways dz,  ABPA,  allergy(esp in young), Aspiration (esp in elderly), Adverse effects of DPI,  Active smokers, plus two Bs  = Bronchiectasis and Beta blocker use..and one C= CHF   Adherence is always the initial "prime suspect" and is a multilayered concern that requires a "trust but verify" approach in every patient - starting with knowing how to use medications, especially inhalers, correctly, keeping up with refills and understanding the fundamental difference between maintenance and prns vs those medications only taken for a very short course and then stopped and not refilled. In this case Adherence is the biggest issue and starts with  inability to use HFA effectively and also  understand that SABA treats the symptoms but doesn't get to the underlying problem (inflammation).  I used  the analogy of putting steroid cream on a rash to help explain the meaning of topical therapy and the need to get the drug to the target tissue.    The proper method of use, as well as anticipated side effects, of a metered-dose inhaler are discussed and demonstrated to the patient. Improved effectiveness after extensive coaching during this visit to a level of approximately  75% so rec trial of symbicort 80 2 bid  then regroup in 6 weeks with pfts on return.

## 2013-02-20 ENCOUNTER — Ambulatory Visit (INDEPENDENT_AMBULATORY_CARE_PROVIDER_SITE_OTHER): Payer: Medicare Other | Admitting: Internal Medicine

## 2013-02-20 ENCOUNTER — Encounter: Payer: Self-pay | Admitting: Internal Medicine

## 2013-02-20 VITALS — BP 138/80 | HR 70 | Temp 97.6°F | Ht 70.0 in | Wt 182.0 lb

## 2013-02-20 DIAGNOSIS — J45909 Unspecified asthma, uncomplicated: Secondary | ICD-10-CM

## 2013-02-20 LAB — PULMONARY FUNCTION TEST

## 2013-02-20 NOTE — Progress Notes (Signed)
PFT done today. 

## 2013-02-20 NOTE — Patient Instructions (Addendum)
If you are satisfied with your treatment plan let your doctor know and he/she can either refill your medications or you can return here when your prescription runs out.     If in any way you are not 100% satisfied,  please tell us.  If 100% better, tell your friends!  

## 2013-02-20 NOTE — Progress Notes (Signed)
  Subjective:    Patient ID: Brian Barron, male    DOB: Mar 02, 1931  MRN: [id number]   Brief patient profile:  59 yowm never smoker with dx of asthma off and on since around age 79 eval by Gene [employer] and dx EIA referred 01/10/2013 to Pulmonary clinic by Dr Marrow for sob.     01/09/2013 1st pulmonary cc new onset am sob x one year indolent onset persistent daily sense of sob and mild wheeze each am better after using albuterol and using avg twice daily= usually at bedtime and usually first thing in am.  Min assoc dry cough.   rec Symbicort 80 Take 2 puffs first thing in am and then another 2 puffs about 12 hours later.  Only use your albuterol (proaire)  As rescue   02/20/2013 f/u ov/Carlon Chaloux cc breathing better no need at all for any rescue rx and not limited by sob   No obvious daytime variabilty or assoc excess mucous production cough or cp or chest tightness, subjective wheeze overt sinus or hb symptoms. No unusual exp hx or h/o childhood pna/ asthma or premature birth to his knowledge.    Sleeping ok without nocturnal flare  of respiratory  c/o's or need for noct saba. Also denies any obvious fluctuation of symptoms with weather or environmental changes or other aggravating or alleviating factors except as outlined above             Objective:   Physical Exam  02/20/2013   Wt 182  Wt Readings from Last 3 Encounters:  01/09/13 185 lb (83.915 kg)    HEENT: nl dentition, turbinates, and orophanx. Nl external ear canals without cough reflex   NECK :  without JVD/Nodes/TM/ nl carotid upstrokes bilaterally   LUNGS: no acc muscle use, clear to A and P bilaterally without cough on insp or exp maneuvers   CV:  RRR  no s3 or murmur or increase in P2, no edema   ABD:  soft and nontender with nl excursion in the supine position. No bruits or organomegaly, bowel sounds nl  MS:  warm without deformities, calf tenderness, cyanosis or clubbing  SKIN: warm and dry without lesions               Assessment & Plan:

## 2013-02-22 NOTE — Assessment & Plan Note (Addendum)
-   PFT's 02/20/13 FEV1 69% s change p B2,  Nl DLC0 c/w mod severe chronic (fixed) asthma  On symbicort 80 2 bid: All goals of chronic asthma control met including optimal(though not nl) function and elimination of symptoms with minimal need for rescue therapy.  I had an extended summary  discussion with the patient today lasting 15 to 20 minutes of a 25 minute visit on the following issues:   confired hfa 90% effective techniqe  Contingencies discussed in full including contacting this office immediately if not controlling the symptoms using the rule of two's.     Pulmonary f/u can be prn

## 2013-03-17 ENCOUNTER — Encounter: Payer: Self-pay | Admitting: Internal Medicine

## 2013-04-04 ENCOUNTER — Encounter: Payer: Self-pay | Admitting: Internal Medicine

## 2013-07-18 ENCOUNTER — Ambulatory Visit (INDEPENDENT_AMBULATORY_CARE_PROVIDER_SITE_OTHER): Payer: Medicare Other | Admitting: Internal Medicine

## 2013-07-18 ENCOUNTER — Encounter: Payer: Self-pay | Admitting: Internal Medicine

## 2013-07-18 VITALS — BP 132/69 | HR 83 | Ht 70.0 in | Wt 181.4 lb

## 2013-07-18 DIAGNOSIS — I440 Atrioventricular block, first degree: Secondary | ICD-10-CM

## 2013-07-18 DIAGNOSIS — I441 Atrioventricular block, second degree: Secondary | ICD-10-CM

## 2013-07-18 DIAGNOSIS — R0989 Other specified symptoms and signs involving the circulatory and respiratory systems: Secondary | ICD-10-CM

## 2013-07-18 DIAGNOSIS — R0609 Other forms of dyspnea: Secondary | ICD-10-CM | POA: Insufficient documentation

## 2013-07-18 HISTORY — DX: Atrioventricular block, second degree: I44.1

## 2013-07-18 HISTORY — DX: Atrioventricular block, first degree: I44.0

## 2013-07-18 NOTE — Patient Instructions (Addendum)
Your physician recommends that you continue on your current medications as directed. Please refer to the Current Medication list given to you today.  Your physician has requested that you have an exercise tolerance test. For further information please visit [email or web address] Please also follow instruction sheet, as given. PLEASE SCHEDULE FOR Monday WITH DR. Graciela Husbands.

## 2013-07-18 NOTE — Assessment & Plan Note (Signed)
The patient has profound first degree AV block with a PR interval of 500 ms. His symptoms of dizziness and exercise intolerance manifested by dyspnea can certainly be  Caused by first degree block of this degree. What happens if you have simultaneous atrial and ventricular contraction and pacemaker syndrome like symptoms. Lightheadedness is a consequence of release of vasomediators   I would recommend that we undertake treadmill testing looking for the impact of increased heart rate in AV relationships and blood pressure.

## 2013-07-18 NOTE — Progress Notes (Signed)
ELECTROPHYSIOLOGY CONSULT NOTE  Patient ID: Brian Barron, MRN: [id number], DOB/AGE: 77-Nov-1932 77 y.o. Admit date: (Not on file) Date of Consult: 07/18/2013  Primary Physician: Laurell Josephs, MD Primary Cardiologist: MS  Chief Complaint:  ? pacemaker   HPI Brian Barron is a 79 y.o. male  With a long-standing history of episodic shortness of breath with exertion that is relieved by rest. It is occasionally but not always associated with lightheadedness which is triggered by standing. This staining can also be associated with a sense of "queasiness" which he also associated with a sense of poor balance; this too is relieved by a prolonged or persistent standing. He rides a bike 20 miles or so at a time; he denies shortness of breath with this. He has no chest discomfort.  Cardiac evaluation has included an echocardiogram which demonstrated normal left ventricular function, mild hypertrophy, mild aortic root dilatation mild left atrial enlargement.  Holter monitoring was undertaken for reasons that are not entirely clear. It demonstrated a variety of abnormalities including 1-accelerated idioventricular rhythm with a 3 beat run of nonsustained ventricular tachycardia 2-sinus pauses with ventricular escape, 3-significant first degree AV block, 4-episodes of 2-1 and Mobitz type I heart block, 5-high-grade heart block with consecutively blocked P waves occurring in the middle of the night.  He denies a history of syncope. He does not have orthopnea or peripheral edema. He does have episodes where he awakens abruptly at night; he does not associate this with shortness of breath      He has hx of Asthma Past Medical History  Diagnosis Date  . Asthma       Surgical History: No past surgical history on file.   Home Meds: Prior to Admission medications   Medication Sig Start Date End Date Taking? Authorizing Provider  aspirin 81 MG tablet Take 81 mg by mouth daily.   Yes Historical  Provider, MD  budesonide-formoterol (SYMBICORT) 80-4.5 MCG/ACT inhaler Inhale 2 puffs into the lungs 2 (two) times daily. 01/09/13  Yes Nyoka Cowden, MD  Melatonin 3 MG CAPS Take 3 mg by mouth at bedtime. Natures Made sleep aid   Yes Historical Provider, MD  vitamin B-12 (CYANOCOBALAMIN) 1000 MCG tablet Take 1,000 mcg by mouth daily.   Yes Historical Provider, MD  albuterol (PROVENTIL HFA;VENTOLIN HFA) 108 (90 BASE) MCG/ACT inhaler Inhale 2 puffs into the lungs every 6 (six) hours as needed.    Historical Provider, MD      Allergies: No Known Allergies  History   Social History  . Marital Status: Married    Spouse Name: N/A    Number of Children: N/A  . Years of Education: N/A   Occupational History  .      retired     Social History Main Topics  . Smoking status: Never Smoker   . Smokeless tobacco: Never Used  . Alcohol Use: Yes  . Drug Use: No  . Sexual Activity: Not on file   Other Topics Concern  . Not on file   Social History Narrative  . No narrative on file     Family History  Problem Relation Age of Onset  . Asthma Brother   . Cancer Mother     breast     ROS:  Please see the history of present illness.     All other systems reviewed and negative.    Physical Exam:   Blood pressure 132/69, pulse 83, height 5\' 10"  (1.778 m), weight 181 lb 6.4 oz (  82.283 kg). General: Well developed, well nourished male in no acute distress. Head: Normocephalic, atraumatic, sclera non-icteric, no xanthomas, nares are without discharge. EENT: normal Lymph Nodes:  none Back: without scoliosis/kyphosis , no CVA tendersness Neck: Negative for carotid bruits. JVD not elevated. Lungs: Clear bilaterally to auscultation without wheezes, rales, or rhonchi. Breathing is unlabored. Heart: RRR with diminished  S2. No /6 systolich murmur , rubs, or gallops appreciated. Abdomen: Soft, non-tender, non-distended with normoactive bowel sounds. No hepatomegaly. No rebound/guarding. No  obvious abdominal masses. Msk:  Strength and tone appear normal for age. Extremities: No clubbing or cyanosis. No  edema.  Distal pedal pulses are 2+ and equal bilaterally. Skin: Warm and Dry Neuro: Alert and oriented X 3. CN III-XII intact Grossly normal sensory and motor function . Psych:  Responds to questions appropriately with a normal affect.      Labs: Cardiac Enzymes No results found for this basename: CKTOTAL, CKMB, TROPONINI,  in the last 72 hours CBC Lab Results  Component Value Date   WBC 7.0 02/29/2008   HGB 14.0 02/29/2008   HCT 39.8 02/29/2008   MCV 93.2 02/29/2008   PLT 122* 02/29/2008   PROTIME:  Radiology/Studies:  ECG: NSR:  50/08/39   Assessment and Plan:   Sherryl Manges

## 2013-07-18 NOTE — Assessment & Plan Note (Signed)
As noted above, there are multitude of arrhythmia issues it may be contributing to exercise intolerance. Interestingly, today, during orthostatic assessment, the patient had a reported heart rate of 140 upon standing. This was noted by the automatic detection machine; when we tried to reproduce this attached to a monitor, we saw nothing. This too might be clarified by an event recorder.

## 2013-07-18 NOTE — Assessment & Plan Note (Signed)
The patient also has a Mobitz 1 heart block as well as some 2:1 heart block. It is not clear that this is symptomatic, not withstanding the episodes of heart rate into the 30s. If the exercise test is unilluminating, I would recommend the use of a 30 day monitor to try to clarify association between   arrhythmia and his symptoms

## 2013-07-21 ENCOUNTER — Ambulatory Visit (INDEPENDENT_AMBULATORY_CARE_PROVIDER_SITE_OTHER): Payer: Medicare Other | Admitting: Internal Medicine

## 2013-07-21 ENCOUNTER — Ambulatory Visit (HOSPITAL_COMMUNITY): Payer: Medicare Other | Attending: Internal Medicine

## 2013-07-21 ENCOUNTER — Encounter: Payer: Self-pay | Admitting: Internal Medicine

## 2013-07-21 ENCOUNTER — Encounter (HOSPITAL_COMMUNITY): Payer: Medicare Other

## 2013-07-21 DIAGNOSIS — R0602 Shortness of breath: Secondary | ICD-10-CM

## 2013-07-21 NOTE — Progress Notes (Deleted)
Exercise Treadmill TePre-Exercise Testing Evaluation                        Test  Exercise Tolerance Test     Unique Test No: 1  Treadmill:  1    Contraindication to ETT: No   Stress Modality: exercise - treadmill  Cardiac Imaging Performed: non   Protocol: standard Cobin - maximal  Max BP:  ***/***  Max MPHR (bpm):  140 85% MPR (bpm):  119  MPHR obtained (bpm):  *** % MPHR obtained:  ***  Reached 85% MPHR (min:sec):  *** Total Exercise Time (min-sec):  ***  Workload in METS:  *** Borg Scale: ***  Reason ETT Terminated:  {CHL REASON TERMINATED FOR ETT:21021064}    ST Segment Analysis At Rest: {CHL ST SEGMENT AT REST FOR HQI:69629528} With Exercise: {CHL ST SEGMENT WITH EXERCISE FOR UXL:24401027}  Other Information Arrhythmia:  {CHL ARRHYTHMIA FOR OZD:66440347} Angina during ETT:  {CHL ANGINA DURING QQV:95638756} Quality of ETT:  {CHL QUALITY OF EPP:29518841}  ETT Interpretation:  {CHL INTERPRETATION FOR YSA:63016010}  Comments: ***  Recommendations: ***

## 2013-07-21 NOTE — Progress Notes (Signed)
Exercise Treadmill Test  Pre-Exercise Testing Evaluation Rhythm: sinus bradycardia  Rate: 48   PR:  QRS:  .08  QT:  .42 QTc: .35            Test  Exercise Tolerance Test Ordering MD: Sherryl Manges, MD  Interpreting MD: Sherryl Manges, MD  Unique Test No: 1  Treadmill:  1  Indication for ETT: exertional dyspnea  Contraindication to ETT: No   Stress Modality: exercise - treadmill  Cardiac Imaging Performed: non   Protocol: standard Sherrod - maximal  Max BP:  186/79  Max MPHR (bpm):  140 85% MPR (bpm):  119  MPHR obtained (bpm):  112 % MPHR obtained:  80  Reached 85% MPHR (min:sec):  Not obtained  Total Exercise Time (min-sec):  2:15  Workload in METS:  4.6 Borg Scale: 15  Reason ETT Terminated:  Ventricular tachycardia    ST Segment Analysis At Rest:  With Exercise:   Other Information Arrhythmia:   Angina during ETT:   Quality of ETT:    ETT Interpretation:  Polymorphic VT with low level of exercise  Comments:    Recommendations: Reviewed with Dr. Anne Fu.  Polymorphic ventricular tachycardia with low-level exercise suggest the possibility of significant obstructive coronary disease. The pretest probability is sufficiently high that I think catheterization is more appropriate diagnostic tool.  There is also evidence of chronotropic incompetence as manifested by 90 block heart block exertion resulting in peak heart rates in the 80s. He also had ventricular ectopy with exertion with functional rates in the 40s. All of it is contributing potentially to his dyspnea

## 2013-07-23 ENCOUNTER — Other Ambulatory Visit: Payer: Self-pay | Admitting: Cardiology

## 2013-07-23 NOTE — Progress Notes (Unsigned)
This encounter was created in error - please disregard.

## 2013-07-24 ENCOUNTER — Institutional Professional Consult (permissible substitution): Payer: Medicare Other | Admitting: Internal Medicine

## 2013-07-31 NOTE — H&P (Signed)
Patient: Brian Barron, Brian Barron Account Number: [id number] Provider: Donato Schultz, MD  DOB: 10/19/1931   Age: 79 Y   Sex: Male Date: 07/22/2013  Phone: [phone]  Address: [address] [place], [id number]  Pcp: Farris Has    --------------------------------------------------------------------------------       1. DISCUSS CARDIAC CATH.      HPI:     General:           79 year old male with history of colonic AVM, first degree AV block here for evaluation of shortness of breath. He states that he has been "huffing and puffing" for approximately 30 seconds after differed exertional event. This can occur even when walking across the room at times. At other times, he seems to be fine. Intermittent. May need to pause to catch his breath at times as well. He does not have any significant chest pain. Pulmonary medicine has seen him but asthma medications do not seem to resolve his symptoms of shortness of breath. He seems to be sleeping less. His heart rate during a previous encounter was 41 beats per minute. EKG today on 06/05/13 demonstrates possible junctional rhythm with T-wave approximately 460 ms from QRS complex, likely nonconducted or perhaps conducted with a very long AV delay.         Dr. Sherene Sires - PFT's with no COPD.                   ECHO:         1. There is mild concentric left ventricle hypertrophy.          2. Left ventricular ejection fraction estimated by 2D at 60-65 percent.          3. There were no regional wall motion abnormalities.          4. Borderline dilated left atrium.          5. Mild mitral valve regurgitation.          6. There is mild tricuspid regurgitation.          7. Mild calcification of the aortic valve.          8. Mild aortic valve regurgitation.          9. There is mild aortic root dilatation. 3.8 cm at the sinus of Valsalva.          10. There is no evidence of elevated left atrial filling pressures.                  Holter report with transient 2-1 AV  block at 8:40 PM. See below for details.                  I had him see Dr. Berton Mount of electrophysiology. an exercise treadmill test was performed to demonstrate chronotropic competence and he had several episodes of polymorphic ventricular tachycardia. Cardiac catheterization was recommended to exclude ischemia..      ROS:      Denies any bleeding, syncope, orthopnea, PND.     Medical History: h/o AVM - colon, Insomnia, Paraesophageal hernia, first degree AV block - Avoid beta blockers, calcium channel blockers & clonidine, Derm: [facility], Optho: Dr Shirlyn Goltz, Ortho: Dr Charlann Boxer.      Surgical History: tonsillectomy , partial amputation & reattachment left second finger , Paraesophageal Hernia Repair 2009, colonoscopy by Dr Boyd Kerbs .      Hospitalization/Major Diagnostic Procedure: as above .      Family History:  Father: deceased  90 yrsMother: deceased 1 yrs, metastatic breast cancer, Cancer Pancreatic, cancer, liver, diagnosed with Breast CaBrother 1: alive 93 yrs, hx. lymphoma     Social History:      General:          Tobacco use              cigarettes:  Never smoked         no Smoking.          Alcohol: yes, occasionally, 3/week.          Caffeine: yes, 2 servings daily, tea.          no Recreational drug use.          Exercise: yes, regularly, bikes.          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          Education: part way through graduate school. .          Marital Status: Married.          Children: 2 - live in [place], [place].          Seat belt use: yes.      Medications: Taking Vitamin B12 100 MCG Tablet 1 tablet Once a day, Taking Aspirin 81 MG Tablet 1 tablet Once a day, Taking Symbicort 80-4.5 MCG/ACT Aerosol 2 puffs Twice a day, Taking L-Threonine Crystals , Notes: 200 MG tab, Taking Melatonin 3 MG Capsule , Not-Taking/PRN Potassium Gluconate 550 MG Tablet , Notes: patient has been taking for leg cramps, Medication List reviewed and  reconciled with the patient     Allergies: N.K.D.A.       Vitals: Wt 184.2, Wt change 1.8 lb, Ht 68.5, BMI 27.60, Pulse sitting 54, BP sitting 134/80.     Examination:     General Examination:         GENERAL APPEARANCE alert, oriented, NAD, pleasant.         SKIN: hyperpigmentation in upper lip right. [name]         HEENT: normal.         HEAD: [place]/AT.         EYES: EOMI, Conjunctiva clear.         NECK: supple, FROM, without evidence of thyromegaly, adenopathy, or bruits, no jugular venous distention (JVD).         LUNGS: clear to auscultation bilaterally, no wheezes, rhonchi, rales, regular breathing rate and effort.         HEART: Bradycardic, regular, no S3, S4, murmur or rub, point of maximul impulse (PMI) normal.         ABDOMEN: soft, non-tender/non-distended, bowel sounds present, no masses palpated, no bruit.         EXTREMITIES: no clubbing, no edema, pulses 2 plus bilaterally.         NEUROLOGIC EXAM: non-focal exam, alert and oriented x 3.         PERIPHERAL PULSES: normal (2+) bilaterally.         LYMPH NODES: no cervical adenopathy.         PSYCH affect normal.      Examination performed an initial consult visit. 2+ radial pulse noted.         Assessment:  1. Ventricular tachycardia - 427.1 (Primary)   2. Abnormal EKG - 794.31   3. Dyspnea - 786.05   4. First degree AV block - 426.11   5. AV block, 2nd degree - 426.13  1. Ventricular tachycardia  Notes: With findings of polymorphic ventricular tachycardia, we will go ahead and pursue cardiac catheterization to exclude ischemia. Discussed with Dr. Graciela Husbands of electrophysiology. Risks and benefits of cardiac catheterization have been reviewed including risk of stroke, heart attack, death, bleeding, renal impariment and arterial damage. There was ample oppurtuny to answer questions. Alternatives were discussed. Patient understands and wishes to proceed.       2. Abnormal EKG       LAB: PT (Prothrombin Time) ([id number])       LAB: Basic Metabolic      LAB: CBC with Diff       Procedures:      Venipuncture:          Venipuncture: Hall,Jechelle 07/22/2013 11:58:53 AM > , performed in left arm.             Labs:        Lab: PT (Prothrombin Time) ([id number])  1.1       Prothrombin Time 11.1  9.1-12.0 - SEC       INR 1.1  0.8-1.2 -                  SKAINS,MARK 07/23/2013 06:55:28 AM > normal Stegall,Amy 07/23/2013 08:26:06 AM > for cath               Lab: Basic Metabolic  Creat 1.05       GLUCOSE 86  70-99 - mg/dL       BUN 20  6-21 - mg/dL       CREATININE 3.08  0.60-1.30 - mg/dl       eGFR (NON-AFRICAN AMERICAN) 68  >60 - calc       eGFR (AFRICAN AMERICAN) 82  >60 - calc       SODIUM 138  136-145 - mmol/L       POTASSIUM 4.8  3.5-5.5 - mmol/L       CHLORIDE 105  98-107 - mmol/L       C02 27  22-32 - mmol/L       ANION GAP 11.1  6.0-20.0 - mmol/L       CALCIUM 9.6  8.6-10.3 - mg/dL                 [facility] 07/23/2013 06:55:06 AM > normal Stegall,Amy 07/23/2013 08:26:16 AM > for cath               Lab: CBC with Diff  Hg 14.8, PLT 144       WBC 5.3  4.0-11.0 - K/ul       RBC 4.67  4.20-5.80 - M/uL       HGB 14.8  13.0-17.0 - g/dL       HCT 65.7  84.6-96.2 - %       MCH 31.6  27.0-33.0 - pg       MPV 10.2  7.5-10.7 - fL       MCV 96.4 H 80.0-94.0 - fL       MCHC 32.8  32.0-36.0 - g/dL       RDW 95.2  84.1-32.4 - %       NRBC# 0.01  -        PLT 144 L 150-400 - K/uL       NEUT % 60.0  43.3-71.9 - %       NRBC% 0.10  - %       LYMPH% 23.5  16.8-43.5 - %  MONO % 13.4 H 4.6-12.4 - %       EOS % 2.3  0.0-7.8 - %       BASO % 0.8  0.0-1.0 - %       NEUT # 3.2  1.9-7.2 - K/uL       LYMPH# 1.20  1.10-2.70 - K/uL       MONO # 0.7  0.3-0.8 - K/uL       EOS # 0.1  0.0-0.6 - K/uL       BASO # 0.0  0.0-0.1 - K/uL                 SKAINS,MARK 07/23/2013 10:00:50 AM > ok for cath.           Procedure Codes: 16109 ECL BMP, 85025 ECL CBC PLATELET DIFF, 60454 BLOOD COLLECTION ROUTINE  VENIPUNCTURE     Follow Up: post cath           Provider: Donato Schultz, MD  Patient: Brian Barron, Brian Barron  DOB: 12-25-1930  Date: 07/22/2013

## 2013-08-01 ENCOUNTER — Encounter (HOSPITAL_BASED_OUTPATIENT_CLINIC_OR_DEPARTMENT_OTHER): Payer: Self-pay

## 2013-08-01 ENCOUNTER — Inpatient Hospital Stay (HOSPITAL_BASED_OUTPATIENT_CLINIC_OR_DEPARTMENT_OTHER)
Admission: RE | Admit: 2013-08-01 | Discharge: 2013-08-01 | Disposition: A | Discharge status: Home / Self Care | Payer: Medicare Other | Source: Ambulatory Visit | Attending: Cardiology | Admitting: Cardiology

## 2013-08-01 ENCOUNTER — Encounter (HOSPITAL_BASED_OUTPATIENT_CLINIC_OR_DEPARTMENT_OTHER)
Admission: RE | Disposition: A | Discharge status: Home / Self Care | Payer: Medicare Other | Source: Ambulatory Visit | Attending: Cardiology

## 2013-08-01 DIAGNOSIS — I472 Ventricular tachycardia: Secondary | ICD-10-CM

## 2013-08-01 DIAGNOSIS — I4729 Other ventricular tachycardia: Secondary | ICD-10-CM | POA: Diagnosis present

## 2013-08-01 DIAGNOSIS — I059 Rheumatic mitral valve disease, unspecified: Secondary | ICD-10-CM | POA: Insufficient documentation

## 2013-08-01 DIAGNOSIS — I441 Atrioventricular block, second degree: Secondary | ICD-10-CM | POA: Diagnosis present

## 2013-08-01 DIAGNOSIS — I440 Atrioventricular block, first degree: Secondary | ICD-10-CM | POA: Diagnosis present

## 2013-08-01 DIAGNOSIS — R0609 Other forms of dyspnea: Secondary | ICD-10-CM | POA: Diagnosis present

## 2013-08-01 DIAGNOSIS — I079 Rheumatic tricuspid valve disease, unspecified: Secondary | ICD-10-CM | POA: Insufficient documentation

## 2013-08-01 DIAGNOSIS — R0602 Shortness of breath: Secondary | ICD-10-CM | POA: Insufficient documentation

## 2013-08-01 HISTORY — DX: Other ventricular tachycardia: I47.29

## 2013-08-01 HISTORY — DX: Ventricular tachycardia: I47.2

## 2013-08-01 SURGERY — JV LEFT HEART CATHETERIZATION WITH CORONARY ANGIOGRAM

## 2013-08-01 MED ORDER — DIAZEPAM 5 MG PO TABS
5.0000 mg | ORAL_TABLET | ORAL | Status: AC
  Administered 2013-08-01: 5 mg via ORAL

## 2013-08-01 MED ORDER — SODIUM CHLORIDE 0.9 % IJ SOLN: 3.0000 mL | INTRAMUSCULAR | Status: DC | PRN

## 2013-08-01 MED ORDER — ACETAMINOPHEN 325 MG PO TABS: 650.0000 mg | ORAL_TABLET | ORAL | Status: DC | PRN

## 2013-08-01 MED ORDER — SODIUM CHLORIDE 0.9 % IV SOLN: 1.0000 mL/kg/h | INTRAVENOUS | Status: DC

## 2013-08-01 MED ORDER — SODIUM CHLORIDE 0.9 % IV SOLN: INTRAVENOUS | Status: DC

## 2013-08-01 MED ORDER — ASPIRIN 81 MG PO CHEW
324.0000 mg | CHEWABLE_TABLET | ORAL | Status: AC
  Administered 2013-08-01: 324 mg via ORAL

## 2013-08-01 MED ORDER — SODIUM CHLORIDE 0.9 % IJ SOLN: 3.0000 mL | Freq: Two times a day (BID) | INTRAMUSCULAR | Status: DC

## 2013-08-01 MED ORDER — SODIUM CHLORIDE 0.9 % IV SOLN: 250.0000 mL | INTRAVENOUS | Status: DC | PRN

## 2013-08-01 MED ORDER — ONDANSETRON HCL 4 MG/2ML IJ SOLN: 4.0000 mg | Freq: Four times a day (QID) | INTRAMUSCULAR | Status: DC | PRN

## 2013-08-01 NOTE — OR Nursing (Signed)
Tegaderm dressing applied, site level 0, bedrest begins at 1100

## 2013-08-01 NOTE — CV Procedure (Signed)
CARDIAC CATHETERIZATION  PROCEDURE:  Left heart catheterization with selective coronary angiography, left ventriculogram.  INDICATIONS:  79 year old male with polymorphic ventricular tachycardia on stress test, first degree AV block, second degree AV block, Dr. Graciela Husbands here for cardiac catheterization to prove that he does not have any significant coronary artery disease contributing to his polymorphic VT.  The risks, benefits, and details of the procedure were explained to the patient.  The patient verbalized understanding and wanted to proceed.  Informed written consent was obtained.  PROCEDURE TECHNIQUE:  Initially, right radial artery was attempted however he has a radial loop. We were unable to successfully pass catheter. Radial artery approach was aborted. Verapamil was administered only. After Xylocaine anesthesia and visualization of the femoral head via fluoroscopy, a 55F sheath was placed in the right femoral artery with a single anterior needle wall stick.   Left coronary angiography was done using a Judkins L4 catheter.  Right coronary angiography was done using a Judkins R4 catheter.  Left ventriculography was done using a pigtail catheter.    CONTRAST:  Total of 65 ml.  FLOUROSCOPY TIME: 4.6 minutes.   COMPLICATIONS:  None.    HEMODYNAMICS:  Aortic pressure was 121/62/43mmHg; LV systolic pressure was 121/60mmHg; LVEDP .  There was no gradient between the left ventricle and aorta.    ANGIOGRAPHIC DATA:    Left main: Left main artery, widely patent no angiographically significant disease.  Left anterior descending (LAD): No angiographically significant disease, 2 significant diagonal branch. Minor luminal irregularities throughout the proximal segment, minor calcification noted proximally. Fairly small caliber vessel.  Circumflex artery (CIRC): 2 obtuse marginal branches noted. No angiographically significant disease. Fairly small caliber vessel once again.  Right coronary  artery (RCA): Dominant vessel giving rise to PDA. Largest caliber vessel. PLT branch and PDA are tortuous distally.  LEFT VENTRICULOGRAM:  Left ventricular angiogram was done in the 30 RAO projection and revealed normal left ventricular wall motion and systolic function with an estimated ejection fraction of 65 %.   IMPRESSIONS:  1. No angiographically significant coronary artery disease. Minor proximal LAD calcification and minor luminal irregularities of this vessel. 2. Normal left ventricular systolic function.  LVEDP 14 mmHg.  Ejection fraction 65%.  RECOMMENDATION:  Reassuring cardiac catheterization. No evidence of significant flow limiting coronary artery disease. I will forward to Dr. Graciela Husbands. During case, bradycardia once again noted, first degree AV block. Heart rate at times and upper 30s, most of the time in low 40s.

## 2013-08-01 NOTE — Interval H&P Note (Signed)
History and Physical Interval Note:  08/01/2013 10:05 AM  Brian Barron  has presented today for surgery, with the diagnosis of cp  The various methods of treatment have been discussed with the patient and family. After consideration of risks, benefits and other options for treatment, the patient has consented to  Procedure(s): JV LEFT HEART CATHETERIZATION WITH CORONARY ANGIOGRAM (N/A) as a surgical intervention .  The patient's history has been reviewed, patient examined, no change in status, stable for surgery.  I have reviewed the patient's chart and labs.  Questions were answered to the patient's satisfaction.     Brian Barron

## 2013-08-01 NOTE — OR Nursing (Signed)
+  Allen's test right hand 

## 2013-08-01 NOTE — OR Nursing (Signed)
TR band removed at 1230, site level 0, pressure dressing and wrist splint applied, distal circulation intact.

## 2013-08-01 NOTE — OR Nursing (Signed)
Dr Skains at bedside to discuss results and treatment plan with pt and family 

## 2013-08-01 NOTE — H&P (View-Only) (Signed)
Patient: Brian Barron, Brian Barron Account Number: [id number] Provider: Mark Skains, MD  DOB: 12/07/1930   Age: 79 Y   Sex: Male Date: 07/22/2013  Phone: [phone]  Address: [address], [place], [place]  Pcp: AARON MORROW    --------------------------------------------------------------------------------       1. DISCUSS CARDIAC CATH.      HPI:     General:           79-year-old male with history of colonic AVM, first degree AV block here for evaluation of shortness of breath. He states that he has been "huffing and puffing" for approximately 30 seconds after differed exertional event. This can occur even when walking across the room at times. At other times, he seems to be fine. Intermittent. May need to pause to catch his breath at times as well. He does not have any significant chest pain. Pulmonary medicine has seen him but asthma medications do not seem to resolve his symptoms of shortness of breath. He seems to be sleeping less. His heart rate during a previous encounter was 41 beats per minute. EKG today on 06/05/13 demonstrates possible junctional rhythm with T-wave approximately 460 ms from QRS complex, likely nonconducted or perhaps conducted with a very long AV delay.         Dr. Wert - PFT's with no COPD.                   ECHO:         1. There is mild concentric left ventricle hypertrophy.          2. Left ventricular ejection fraction estimated by 2D at 60-65 percent.          3. There were no regional wall motion abnormalities.          4. Borderline dilated left atrium.          5. Mild mitral valve regurgitation.          6. There is mild tricuspid regurgitation.          7. Mild calcification of the aortic valve.          8. Mild aortic valve regurgitation.          9. There is mild aortic root dilatation. 3.8 cm at the sinus of Valsalva.          10. There is no evidence of elevated left atrial filling pressures.                  Holter report with transient 2-1 AV  block at 8:40 PM. See below for details.                  I had him see Dr. Steve Klein of electrophysiology. an exercise treadmill test was performed to demonstrate chronotropic competence and he had several episodes of polymorphic ventricular tachycardia. Cardiac catheterization was recommended to exclude ischemia..      ROS:      Denies any bleeding, syncope, orthopnea, PND.     Medical History: h/o AVM - colon, Insomnia, Paraesophageal hernia, first degree AV block - Avoid beta blockers, calcium channel blockers & clonidine, Derm: [facility], Optho: Dr Heckler, Ortho: Dr Olin.      Surgical History: tonsillectomy , partial amputation & reattachment left second finger , Paraesophageal Hernia Repair 2009, colonoscopy by Dr Jim Weissman .      Hospitalization/Major Diagnostic Procedure: as above .      Family History:  Father: deceased   90 yrsMother: deceased 54 yrs, metastatic breast cancer, Cancer Pancreatic, cancer, liver, diagnosed with Breast CaBrother 1: alive 84 yrs, hx. lymphoma     Social History:      General:          Tobacco use              cigarettes:  Never smoked         no Smoking.          Alcohol: yes, occasionally, 3/week.          Caffeine: yes, 2 servings daily, tea.          no Recreational drug use.          Exercise: yes, regularly, bikes.          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          Education: part way through graduate school. .          Marital Status: Married.          Children: 2 - live in [place], [place].          Seat belt use: yes.      Medications: Taking Vitamin B12 100 MCG Tablet 1 tablet Once a day, Taking Aspirin 81 MG Tablet 1 tablet Once a day, Taking Symbicort 80-4.5 MCG/ACT Aerosol 2 puffs Twice a day, Taking L-Threonine Crystals , Notes: 200 MG tab, Taking Melatonin 3 MG Capsule , Not-Taking/PRN Potassium Gluconate 550 MG Tablet , Notes: patient has been taking for leg cramps, Medication List reviewed and  reconciled with the patient     Allergies: N.K.D.A.       Vitals: Wt 184.2, Wt change 1.8 lb, Ht 68.5, BMI 27.60, Pulse sitting 54, BP sitting 134/80.     Examination:     General Examination:         GENERAL APPEARANCE alert, oriented, NAD, pleasant.         SKIN: hyperpigmentation in upper lip right. .         HEENT: normal.         HEAD: /AT.         EYES: EOMI, Conjunctiva clear.         NECK: supple, FROM, without evidence of thyromegaly, adenopathy, or bruits, no jugular venous distention (JVD).         LUNGS: clear to auscultation bilaterally, no wheezes, rhonchi, rales, regular breathing rate and effort.         HEART: Bradycardic, regular, no S3, S4, murmur or rub, point of maximul impulse (PMI) normal.         ABDOMEN: soft, non-tender/non-distended, bowel sounds present, no masses palpated, no bruit.         EXTREMITIES: no clubbing, no edema, pulses 2 plus bilaterally.         NEUROLOGIC EXAM: non-focal exam, alert and oriented x 3.         PERIPHERAL PULSES: normal (2+) bilaterally.         LYMPH NODES: no cervical adenopathy.         PSYCH affect normal.      Examination performed an initial consult visit. 2+ radial pulse noted.         Assessment:  1. Ventricular tachycardia - 427.1 (Primary)   2. Abnormal EKG - 794.31   3. Dyspnea - 786.05   4. First degree AV block - 426.11   5. AV block, 2nd degree - 426.13         1. Ventricular tachycardia  Notes: With findings of polymorphic ventricular tachycardia, we will go ahead and pursue cardiac catheterization to exclude ischemia. Discussed with Dr. Klein of electrophysiology. Risks and benefits of cardiac catheterization have been reviewed including risk of stroke, heart attack, death, bleeding, renal impariment and arterial damage. There was ample oppurtuny to answer questions. Alternatives were discussed. Patient understands and wishes to proceed.       2. Abnormal EKG       LAB: PT (Prothrombin Time) (005199)       LAB: Basic Metabolic      LAB: CBC with Diff       Procedures:      Venipuncture:          Venipuncture: Hall,Jechelle 07/22/2013 11:58:53 AM > , performed in left arm.             Labs:        Lab: PT (Prothrombin Time) (005199)  1.1       Prothrombin Time 11.1  9.1-12.0 - SEC       INR 1.1  0.8-1.2 -                  SKAINS,MARK 07/23/2013 06:55:28 AM > normal Stegall,Amy 07/23/2013 08:26:06 AM > for cath               Lab: Basic Metabolic  Creat 1.05       GLUCOSE 86  70-99 - mg/dL       BUN 20  6-26 - mg/dL       CREATININE 1.05  0.60-1.30 - mg/dl       eGFR (NON-AFRICAN AMERICAN) 68  >60 - calc       eGFR (AFRICAN AMERICAN) 82  >60 - calc       SODIUM 138  136-145 - mmol/L       POTASSIUM 4.8  3.5-5.5 - mmol/L       CHLORIDE 105  98-107 - mmol/L       C02 27  22-32 - mmol/L       ANION GAP 11.1  6.0-20.0 - mmol/L       CALCIUM 9.6  8.6-10.3 - mg/dL                 SKAINS,MARK 07/23/2013 06:55:06 AM > normal Stegall,Amy 07/23/2013 08:26:16 AM > for cath               Lab: CBC with Diff  Hg 14.8, PLT 144       WBC 5.3  4.0-11.0 - K/ul       RBC 4.67  4.20-5.80 - M/uL       HGB 14.8  13.0-17.0 - g/dL       HCT 45.0  39.0-52.0 - %       MCH 31.6  27.0-33.0 - pg       MPV 10.2  7.5-10.7 - fL       MCV 96.4 H 80.0-94.0 - fL       MCHC 32.8  32.0-36.0 - g/dL       RDW 13.6  11.5-15.5 - %       NRBC# 0.01  -        PLT 144 L 150-400 - K/uL       NEUT % 60.0  43.3-71.9 - %       NRBC% 0.10  - %       LYMPH% 23.5  16.8-43.5 - %         MONO % 13.4 H 4.6-12.4 - %       EOS % 2.3  0.0-7.8 - %       BASO % 0.8  0.0-1.0 - %       NEUT # 3.2  1.9-7.2 - K/uL       LYMPH# 1.20  1.10-2.70 - K/uL       MONO # 0.7  0.3-0.8 - K/uL       EOS # 0.1  0.0-0.6 - K/uL       BASO # 0.0  0.0-0.1 - K/uL                 SKAINS,MARK 07/23/2013 10:00:50 AM > ok for cath.           Procedure Codes: 80048 ECL BMP, 85025 ECL CBC PLATELET DIFF, 36415 BLOOD COLLECTION ROUTINE  VENIPUNCTURE     Follow Up: post cath           Provider: Mark Skains, MD  Patient: Vanwingerden, Marshal Barron  DOB: 07/04/1931  Date: 07/22/2013     

## 2013-08-01 NOTE — OR Nursing (Signed)
Discharge instructions reviewed, pt stated understanding, ambulated in hall without difficulty, site level 0, transported to wife's car via wheelchair

## 2013-08-01 NOTE — OR Nursing (Signed)
Meal served 

## 2013-08-12 ENCOUNTER — Ambulatory Visit (INDEPENDENT_AMBULATORY_CARE_PROVIDER_SITE_OTHER): Payer: Medicare Other | Admitting: Internal Medicine

## 2013-08-12 ENCOUNTER — Encounter: Payer: Self-pay | Admitting: *Deleted

## 2013-08-12 VITALS — BP 133/72 | HR 57 | Ht 70.0 in | Wt 183.0 lb

## 2013-08-12 DIAGNOSIS — I441 Atrioventricular block, second degree: Secondary | ICD-10-CM

## 2013-08-12 NOTE — Patient Instructions (Addendum)
Your physician has recommended that you have a pacemaker inserted. A pacemaker is a small device that is placed under the skin of your chest or abdomen to help control abnormal heart rhythms. This device uses electrical pulses to prompt the heart to beat at a normal rate. Pacemakers are used to treat heart rhythms that are too slow. Wire (leads) are attached to the pacemaker that goes into the chambers of you heart. This is done in the hospital and usually requires and overnight stay. Please see the instruction sheet given to you today for more information. Your surgery date is 09/01/13 at 9:30am  Your physician recommends that you continue on your current medications as directed. Please refer to the Current Medication list given to you today.  Your physician recommends that you return for lab work on: 08/25/13 (BMET/CBCD/INR)

## 2013-08-12 NOTE — Progress Notes (Signed)
Patient Care Team: Aaron P Morrow as PCP - General (Specialist)   HPI  Jocob Breithaupt is a 79 y.o. male Seen in followup for her exercise associated ventricular ectopy exercise associated shortness of breath resting bradycardia.  He was initially seen with a Holter monitor demonstrating accelerated idioventricular rhythm, sinus pauses, significant first degree Mobitz 1 heart block at rest and symptomatic bradycardia that worsened with exertion it was anticipated that what we would see on stress testing was higher grade heart block presenting at higher heart rates.  Important that she developed one-to-one conduction a heart rate of 90 beats per minute but then developed bradycardia-functional at rates of 45 secondary to bigeminy. This was berry symptomatic. He did also have episodes of nonsustained ventricular tachycardia with a series of different morphologies. He underwent catheterization demonstrated normal left ventricular function and nonobstructive coronary disease.  He continues to have exercise intolerance associated with social bradycardia with heart rates in the 40s and 50s that worsens with exercise Past Medical History  Diagnosis Date  . Asthma     No past surgical history on file.  Current Outpatient Prescriptions  Medication Sig Dispense Refill  . albuterol (PROVENTIL HFA;VENTOLIN HFA) 108 (90 BASE) MCG/ACT inhaler Inhale 2 puffs into the lungs every 6 (six) hours as needed.      . aspirin 81 MG tablet Take 81 mg by mouth daily.      . budesonide-formoterol (SYMBICORT) 80-4.5 MCG/ACT inhaler Inhale 2 puffs into the lungs 2 (two) times daily.  1 Inhaler  12  . Melatonin 3 MG CAPS Take 3 mg by mouth at bedtime. Natures Made sleep aid      . vitamin B-12 (CYANOCOBALAMIN) 1000 MCG tablet Take 1,000 mcg by mouth daily.       No current facility-administered medications for this visit.    No Known Allergies  Review of Systems negative except from HPI and PMH  Physical  Exam BP 133/72  Pulse 57  Ht 5' 10" (1.778 m)  Wt 183 lb (83.008 kg)  BMI 26.26 kg/m2 Well developed and nourished in no acute distress HENT normal Neck supple with JVP-flat Clear Slow and irregular and rhythm, no murmurs or gallops Abd-soft with active BS No Clubbing cyanosis edema Skin-warm and dry A & Oriented  Grossly normal sensory and motor function     Assessment and  Plan;    1. Symptomatic bradycardia: The patient has resting bradycardia with Mobitz 1 and 2:1 heart block on exercise developed functional bradycardia with the abrupt change in pulse from 90-100 beats a minute to 45-50 beats per minute associated with bigeminy.  2. Ventricular tachycardia-nonsustained. Treatment options for this are exceedingly limited to death as a backup bradycardia pacing given that the medications will aggravate bradycardia and/or conduction system disease  As a consequence, we have discussed antitachycardia pacing with potential solution.The benefits and risks were reviewed including but not limited to death,  perforation, infection, lead dislodgement and device malfunction.  The patient understands agrees and is willing to proceed.    

## 2013-08-22 ENCOUNTER — Encounter (HOSPITAL_COMMUNITY): Payer: Self-pay | Admitting: Pharmacist

## 2013-08-25 ENCOUNTER — Ambulatory Visit
Admission: RE | Admit: 2013-08-25 | Discharge: 2013-08-25 | Disposition: A | Discharge status: Home / Self Care | Payer: Medicare Other | Source: Ambulatory Visit

## 2013-08-25 ENCOUNTER — Other Ambulatory Visit (INDEPENDENT_AMBULATORY_CARE_PROVIDER_SITE_OTHER): Payer: Medicare Other

## 2013-08-25 ENCOUNTER — Other Ambulatory Visit: Payer: Self-pay

## 2013-08-25 DIAGNOSIS — R0781 Pleurodynia: Secondary | ICD-10-CM

## 2013-08-25 DIAGNOSIS — I441 Atrioventricular block, second degree: Secondary | ICD-10-CM

## 2013-08-25 LAB — CBC WITH DIFFERENTIAL/PLATELET
Basophils Absolute: 0 10*3/uL (ref 0.0–0.1)
Basophils Relative: 0.3 % (ref 0.0–3.0)
Eosinophils Absolute: 0.1 10*3/uL (ref 0.0–0.7)
Eosinophils Relative: 1.1 % (ref 0.0–5.0)
HCT: 46.5 % (ref 39.0–52.0)
Lymphs Abs: 1 10*3/uL (ref 0.7–4.0)
MCHC: 33.2 g/dL (ref 30.0–36.0)
MCV: 95.8 fl (ref 78.0–100.0)
Monocytes Absolute: 0.8 10*3/uL (ref 0.1–1.0)
Platelets: 160 10*3/uL (ref 150.0–400.0)
RBC: 4.86 Mil/uL (ref 4.22–5.81)
RDW: 13.6 % (ref 11.5–14.6)

## 2013-08-25 LAB — BASIC METABOLIC PANEL
BUN: 16 mg/dL (ref 6–23)
CO2: 26 mEq/L (ref 19–32)
Calcium: 9.3 mg/dL (ref 8.4–10.5)
Chloride: 103 mEq/L (ref 96–112)
Creatinine, Ser: 1.1 mg/dL (ref 0.4–1.5)
Glucose, Bld: 94 mg/dL (ref 70–99)
Potassium: 4.4 mEq/L (ref 3.5–5.1)

## 2013-08-25 LAB — PROTIME-INR: Prothrombin Time: 10.9 s (ref 10.2–12.4)

## 2013-08-27 ENCOUNTER — Telehealth: Payer: Self-pay | Admitting: Internal Medicine

## 2013-08-27 NOTE — Telephone Encounter (Signed)
Patient asking if he could take this medication night before procedure - I explained that was fine, NPO after midnight. Patient agreeable to plan.

## 2013-08-27 NOTE — Telephone Encounter (Signed)
Follow Up:  Pt states he is returning Sherri's call

## 2013-08-27 NOTE — Telephone Encounter (Signed)
New Problem  Pt was advised by primary care, Dr. Kateri Plummer to keep Dr. Graciela Husbands informed that he fractured 3 ribs. Pt states he has no pain but is having trouble sleeping.Brian Barron He will soon take Hydrocortisone and Ambien and wanted to make sure that it wouldn't infect the pacemaker.

## 2013-08-31 MED ORDER — CEFAZOLIN SODIUM-DEXTROSE 2-3 GM-% IV SOLR
2.0000 g | INTRAVENOUS | Status: DC
  Filled 2013-08-31: qty 50

## 2013-08-31 MED ORDER — SODIUM CHLORIDE 0.9 % IR SOLN
80.0000 mg | Status: DC
  Filled 2013-08-31: qty 2

## 2013-09-01 ENCOUNTER — Ambulatory Visit (HOSPITAL_COMMUNITY)
Admission: RE | Admit: 2013-09-01 | Discharge: 2013-09-02 | Disposition: A | Discharge status: Home / Self Care | Payer: Medicare Other | Source: Ambulatory Visit | Attending: Internal Medicine | Admitting: Internal Medicine

## 2013-09-01 ENCOUNTER — Encounter (HOSPITAL_COMMUNITY)
Admission: RE | Disposition: A | Discharge status: Home / Self Care | Payer: Self-pay | Source: Ambulatory Visit | Attending: Internal Medicine

## 2013-09-01 ENCOUNTER — Telehealth: Payer: Self-pay | Admitting: Physician Assistant

## 2013-09-01 DIAGNOSIS — I498 Other specified cardiac arrhythmias: Secondary | ICD-10-CM | POA: Insufficient documentation

## 2013-09-01 DIAGNOSIS — K449 Diaphragmatic hernia without obstruction or gangrene: Secondary | ICD-10-CM | POA: Insufficient documentation

## 2013-09-01 DIAGNOSIS — I472 Ventricular tachycardia, unspecified: Secondary | ICD-10-CM | POA: Insufficient documentation

## 2013-09-01 DIAGNOSIS — J45909 Unspecified asthma, uncomplicated: Secondary | ICD-10-CM | POA: Insufficient documentation

## 2013-09-01 DIAGNOSIS — I4729 Other ventricular tachycardia: Secondary | ICD-10-CM | POA: Insufficient documentation

## 2013-09-01 DIAGNOSIS — I441 Atrioventricular block, second degree: Secondary | ICD-10-CM

## 2013-09-01 HISTORY — PX: PERMANENT PACEMAKER INSERTION: SHX5480

## 2013-09-01 LAB — SURGICAL PCR SCREEN: MRSA, PCR: NEGATIVE

## 2013-09-01 SURGERY — PERMANENT PACEMAKER INSERTION
Anesthesia: LOCAL

## 2013-09-01 MED ORDER — CHLORHEXIDINE GLUCONATE 4 % EX LIQD: 60.0000 mL | Freq: Once | CUTANEOUS | Status: DC

## 2013-09-01 MED ORDER — ACETAMINOPHEN 325 MG PO TABS: 325.0000 mg | ORAL_TABLET | ORAL | Status: DC | PRN

## 2013-09-01 MED ORDER — HEPARIN (PORCINE) IN NACL 2-0.9 UNIT/ML-% IJ SOLN
INTRAMUSCULAR | Status: AC
  Filled 2013-09-01: qty 500

## 2013-09-01 MED ORDER — ASPIRIN EC 81 MG PO TBEC
81.0000 mg | DELAYED_RELEASE_TABLET | Freq: Every day | ORAL | Status: DC
  Filled 2013-09-01 (×2): qty 1

## 2013-09-01 MED ORDER — SODIUM CHLORIDE 0.9 % IV SOLN
INTRAVENOUS | Status: DC
  Administered 2013-09-01: 09:00:00 via INTRAVENOUS

## 2013-09-01 MED ORDER — HYDROCODONE-ACETAMINOPHEN 5-325 MG PO TABS
1.0000 | ORAL_TABLET | ORAL | Status: DC | PRN
  Administered 2013-09-01: 2 via ORAL
  Filled 2013-09-01: qty 2

## 2013-09-01 MED ORDER — ASPIRIN 81 MG PO TABS: 81.0000 mg | ORAL_TABLET | Freq: Every day | ORAL | Status: DC

## 2013-09-01 MED ORDER — LIDOCAINE HCL (PF) 1 % IJ SOLN
INTRAMUSCULAR | Status: AC
  Filled 2013-09-01: qty 60

## 2013-09-01 MED ORDER — ONDANSETRON HCL 4 MG/2ML IJ SOLN: 4.0000 mg | Freq: Four times a day (QID) | INTRAMUSCULAR | Status: DC | PRN

## 2013-09-01 MED ORDER — BUDESONIDE-FORMOTEROL FUMARATE 80-4.5 MCG/ACT IN AERO
2.0000 | INHALATION_SPRAY | Freq: Two times a day (BID) | RESPIRATORY_TRACT | Status: DC
  Administered 2013-09-01: 2 via RESPIRATORY_TRACT
  Filled 2013-09-01: qty 6.9

## 2013-09-01 MED ORDER — CEFAZOLIN SODIUM 1-5 GM-% IV SOLN
1.0000 g | Freq: Four times a day (QID) | INTRAVENOUS | Status: AC
  Administered 2013-09-01 (×2): 1 g via INTRAVENOUS
  Filled 2013-09-01 (×2): qty 50

## 2013-09-01 MED ORDER — MIDAZOLAM HCL 5 MG/5ML IJ SOLN
INTRAMUSCULAR | Status: AC
  Filled 2013-09-01: qty 5

## 2013-09-01 MED ORDER — CEFAZOLIN SODIUM 1-5 GM-% IV SOLN
1.0000 g | Freq: Four times a day (QID) | INTRAVENOUS | Status: DC
  Filled 2013-09-01 (×3): qty 50

## 2013-09-01 MED ORDER — FENTANYL CITRATE 0.05 MG/ML IJ SOLN
INTRAMUSCULAR | Status: AC
  Filled 2013-09-01: qty 2

## 2013-09-01 MED ORDER — MUPIROCIN 2 % EX OINT
TOPICAL_OINTMENT | CUTANEOUS | Status: AC
  Filled 2013-09-01: qty 22

## 2013-09-01 MED ORDER — SODIUM CHLORIDE 0.9 % IV SOLN
INTRAVENOUS | Status: AC
  Administered 2013-09-01: 12:00:00 via INTRAVENOUS

## 2013-09-01 MED ORDER — VITAMIN B-12 1000 MCG PO TABS
1000.0000 ug | ORAL_TABLET | Freq: Every day | ORAL | Status: DC
  Filled 2013-09-01: qty 1

## 2013-09-01 MED ORDER — MUPIROCIN 2 % EX OINT
TOPICAL_OINTMENT | Freq: Two times a day (BID) | CUTANEOUS | Status: DC
  Administered 2013-09-01: 1 via NASAL
  Filled 2013-09-01: qty 22

## 2013-09-01 NOTE — Interval H&P Note (Signed)
History and Physical Interval Note:  09/01/2013 9:20 AM  Brian Barron  has presented today for surgery, with the diagnosis of 2nd degree HB  The various methods of treatment have been discussed with the patient and family. After consideration of risks, benefits and other options for treatment, the patient has consented to  Procedure(s): PERMANENT PACEMAKER INSERTION (N/A) as a surgical intervention .  The patient's history has been reviewed, patient examined, no change in status, stable for surgery.  I have reviewed the patient's chart and labs.  Questions were answered to the patient's satisfaction.     Sherryl Manges  Pacing for exercie assoc bradycardia

## 2013-09-01 NOTE — H&P (View-Only) (Signed)
Patient Care Team: Laurell Josephs as PCP - General (Specialist)   HPI  Brian Barron is a 79 y.o. male Seen in followup for her exercise associated ventricular ectopy exercise associated shortness of breath resting bradycardia.  He was initially seen with a Holter monitor demonstrating accelerated idioventricular rhythm, sinus pauses, significant first degree Mobitz 1 heart block at rest and symptomatic bradycardia that worsened with exertion it was anticipated that what we would see on stress testing was higher grade heart block presenting at higher heart rates.  Important that she developed one-to-one conduction a heart rate of 90 beats per minute but then developed bradycardia-functional at rates of 45 secondary to bigeminy. This was berry symptomatic. He did also have episodes of nonsustained ventricular tachycardia with a series of different morphologies. He underwent catheterization demonstrated normal left ventricular function and nonobstructive coronary disease.  He continues to have exercise intolerance associated with social bradycardia with heart rates in the 40s and 50s that worsens with exercise Past Medical History  Diagnosis Date  . Asthma     No past surgical history on file.  Current Outpatient Prescriptions  Medication Sig Dispense Refill  . albuterol (PROVENTIL HFA;VENTOLIN HFA) 108 (90 BASE) MCG/ACT inhaler Inhale 2 puffs into the lungs every 6 (six) hours as needed.      [name] aspirin 81 MG tablet Take 81 mg by mouth daily.      . budesonide-formoterol (SYMBICORT) 80-4.5 MCG/ACT inhaler Inhale 2 puffs into the lungs 2 (two) times daily.  1 Inhaler  12  . Melatonin 3 MG CAPS Take 3 mg by mouth at bedtime. Natures Made sleep aid      . vitamin B-12 (CYANOCOBALAMIN) 1000 MCG tablet Take 1,000 mcg by mouth daily.       No current facility-administered medications for this visit.    No Known Allergies  Review of Systems negative except from HPI and PMH  Physical  Exam BP 133/72  Pulse 57  Ht 5\' 10"  (1.778 m)  Wt 183 lb (83.008 kg)  BMI 26.26 kg/m2 Well developed and nourished in no acute distress HENT normal Neck supple with JVP-flat Clear Slow and irregular and rhythm, no murmurs or gallops Abd-soft with active BS No Clubbing cyanosis edema Skin-warm and dry A & Oriented  Grossly normal sensory and motor function     Assessment and  Plan;    1. Symptomatic bradycardia: The patient has resting bradycardia with Mobitz 1 and 2:1 heart block on exercise developed functional bradycardia with the abrupt change in pulse from 90-100 beats a minute to 45-50 beats per minute associated with bigeminy.  2. Ventricular tachycardia-nonsustained. Treatment options for this are exceedingly limited to death as a backup bradycardia pacing given that the medications will aggravate bradycardia and/or conduction system disease  As a consequence, we have discussed antitachycardia pacing with potential solution.The benefits and risks were reviewed including but not limited to death,  perforation, infection, lead dislodgement and device malfunction.  The patient understands agrees and is willing to proceed.

## 2013-09-01 NOTE — CV Procedure (Signed)
Preop DX:: Post op DX:: same  Procedure  dual pacemaker implantation  After routine prep and drape, lidocaine was infiltrated in the prepectoral subclavicular region on the left side an incision was made and carried down to later the prepectoral fascia using electrocautery and sharp dissection a pocket was formed similarly. Hemostasis was obtained.  After this, we turned our attention to gaining accessm to the extrathoracic,left subclavian vein. This was accomplished without difficulty and without the aspiration of air or puncture of the artery. 2 separate venipunctures were accomplished; guidewires were placed and retained and sequentially 7 French sheath through which were  passed an Medtronic 5076  ventricular lead serial [id number] and an Medtronic 5076 atrial lead serial number [id number] .  The ventricular lead was manipulated to the right ventricular apex with a bipolar R wave was 13.8, the pacing impedance was 721, the threshold was 0.6 @ 0.5 msec  Current at threshold was   1.2  Ma and the current of injury was  brisk.  The right atrial lead was manipulated to the right atrial appendage  with a bipolar P-wave  3.8, the pacing impedance was 563, the threshold 0.7@ 0.5 msec   Current at threshold was 1.2  Ma and the current of injury was brisk.   The leads were affixed to the prepectoral fascia and attached to a  [employer]   pulse generator serial number [id number].  Hemostasis was obtained. The pocket was copiously irrigated with antibiotic containing saline solution. The leads and the pulse generator were placed in the pocket and affixed to the prepectoral fascia. The wound was then closed in 23 layers in the normal fashion. And a dermabond adhesive was applied   Needle  Count, sponge counts and instrument counts were correct at the end of the procedure .   The patient tolerated the procedure without apparent complication.  Gerlene Burdock.D.

## 2013-09-02 ENCOUNTER — Ambulatory Visit (HOSPITAL_COMMUNITY): Payer: Medicare Other

## 2013-09-02 DIAGNOSIS — I441 Atrioventricular block, second degree: Secondary | ICD-10-CM

## 2013-09-02 NOTE — Discharge Summary (Signed)
ELECTROPHYSIOLOGY DISCHARGE SUMMARY    Patient ID: Brian Barron,  MRN: [id number], DOB/AGE: 1931-09-03 79 y.o.  Admit date: 09/01/2013 Discharge date: 09/02/2013  Primary Care Physician: Brian Josephs, MD Primary Cardiologist: Brian Husbands, MD  Primary Discharge Diagnosis:  1. Symptomatic bradycardia s/p PPM implant 2. NSVT  Secondary Discharge Diagnoses:  1. Asthma  Procedures This Admission:  1. Dual chamber PPM implantation RA lead - Medtronic 5076 atrial lead serial number [id number] RV lead - Medtronic 5076 ventricular lead serial [id number]  Device - [employer] pulse generator serial number [id number]  History and Hospital Course:  Brian Barron is an 79 year old man with symptomatic bradycardia who presented yesterday for PPM implantation. He tolerated this procedure well without any immediate complication. He remains hemodynamically stable and afebrile. His chest xray shows stable lead placement without pneumothorax. His device interrogation shows normal PPM function with stable lead parameters/measurements. His implant site is intact without significant bleeding or hematoma. He has been given discharge instructions including wound care and activity restrictions. He will follow-up in 10 days for wound check. There were no changes made to his medications. He has been seen, examined and deemed stable for discharge today by Dr. Berton Barron.  Physical Exam:  Vitals: Blood pressure 133/62, pulse 62, temperature 98.5 F (36.9 C), temperature source Oral, resp. rate 16, height 5\' 10"  (1.778 m), weight 183 lb (83.008 kg), SpO2 96.00%.  General: Well developed, well appearing 80 year old male in no acute distress. Neck: Supple. JVD not elevated. Lungs: Clear bilaterally to auscultation without wheezes, rales, or rhonchi. Breathing is unlabored. Heart: RRR S1 S2 without murmurs, rubs, or gallops.  Abdomen: Soft, non-distended. Extremities: No clubbing or cyanosis. No edema.   Distal pedal pulses are 2+ and equal bilaterally. Neuro: Alert and oriented X 3. Moves all extremities spontaneously. No focal deficits. Skin: Left upper chest / implant site intact without bleeding or hematoma.   Labs: Lab Results  Component Value Date   WBC 8.0 08/25/2013   HGB 15.5 08/25/2013   HCT 46.5 08/25/2013   MCV 95.8 08/25/2013   PLT 160.0 08/25/2013      Component Value Date/Time   NA 135 08/25/2013 1202   K 4.4 08/25/2013 1202   CL 103 08/25/2013 1202   CO2 26 08/25/2013 1202   GLUCOSE 94 08/25/2013 1202   BUN 16 08/25/2013 1202   CREATININE 1.1 08/25/2013 1202   CALCIUM 9.3 08/25/2013 1202    Disposition:  The patient is being discharged in stable condition.  Follow-up: Follow-up Information   Follow up with [facility] [employer] On 09/15/2013. (At 4:30 PM for wound check)    Specialty:  Electrophysiology   Contact information:   [address] [place] [address] [phone]      Follow up with Brian Manges, MD In 3 months. (Our office will call you with your appointment date and time)    Specialty:  Cardiology   Contact information:   [address] [place] [address] [phone]      Discharge Medications:    Medication List    ASK your doctor about these medications       aspirin 81 MG tablet  Take 81 mg by mouth daily.     budesonide-formoterol 80-4.5 MCG/ACT inhaler  Commonly known as:  SYMBICORT  Inhale 2 puffs into the lungs 2 (two) times daily.     Melatonin 3 MG Caps  Take 3 mg by mouth at bedtime  as needed (sleep). Natures Made sleep aid     methylcellulose 1 % ophthalmic solution  Commonly known as:  ARTIFICIAL TEARS  Place 1 drop into both eyes daily as needed (dry eyes).     Potassium Gluconate 550 MG Tabs  Take 1 tablet by mouth daily.     vitamin B-12 1000 MCG tablet  Commonly known as:  CYANOCOBALAMIN  Take 1,000 mcg by mouth daily.       Duration of Discharge  Encounter: Greater than 30 minutes including physician time.  Signed, Brian Duff, PA-C 09/02/2013, 7:13 AM

## 2013-09-02 NOTE — Progress Notes (Signed)
Pt provided with dc instructions and education. Pt verbalized understanding. NO questins at this time. Pt aware of arm restrictions and able to verbalize understanding. Levonne Spiller, RN

## 2013-09-15 ENCOUNTER — Ambulatory Visit (INDEPENDENT_AMBULATORY_CARE_PROVIDER_SITE_OTHER): Payer: Medicare Other | Admitting: *Deleted

## 2013-09-15 DIAGNOSIS — I441 Atrioventricular block, second degree: Secondary | ICD-10-CM

## 2013-09-15 LAB — PACEMAKER DEVICE OBSERVATION
AL THRESHOLD: 0.5 V
ATRIAL PACING PM: 67
DEVICE MODEL PM: 112600
RV LEAD IMPEDENCE PM: 624 Ohm
RV LEAD THRESHOLD: 0.6 V
VENTRICULAR PACING PM: 100

## 2013-09-15 NOTE — Progress Notes (Signed)

## 2013-09-30 ENCOUNTER — Encounter: Payer: Self-pay | Admitting: Internal Medicine

## 2013-10-09 ENCOUNTER — Other Ambulatory Visit: Payer: Self-pay

## 2013-12-01 ENCOUNTER — Encounter: Payer: Self-pay | Admitting: *Deleted

## 2013-12-25 ENCOUNTER — Ambulatory Visit (INDEPENDENT_AMBULATORY_CARE_PROVIDER_SITE_OTHER): Payer: Medicare Other | Admitting: Internal Medicine

## 2013-12-25 ENCOUNTER — Encounter: Payer: Self-pay | Admitting: Internal Medicine

## 2013-12-25 VITALS — BP 134/72 | HR 63 | Ht 70.0 in | Wt 186.8 lb

## 2013-12-25 DIAGNOSIS — Z950 Presence of cardiac pacemaker: Secondary | ICD-10-CM

## 2013-12-25 DIAGNOSIS — R0989 Other specified symptoms and signs involving the circulatory and respiratory systems: Secondary | ICD-10-CM

## 2013-12-25 DIAGNOSIS — I441 Atrioventricular block, second degree: Secondary | ICD-10-CM

## 2013-12-25 DIAGNOSIS — I4729 Other ventricular tachycardia: Secondary | ICD-10-CM

## 2013-12-25 DIAGNOSIS — I472 Ventricular tachycardia: Secondary | ICD-10-CM

## 2013-12-25 DIAGNOSIS — I498 Other specified cardiac arrhythmias: Secondary | ICD-10-CM

## 2013-12-25 DIAGNOSIS — R001 Bradycardia, unspecified: Secondary | ICD-10-CM

## 2013-12-25 DIAGNOSIS — R0609 Other forms of dyspnea: Secondary | ICD-10-CM

## 2013-12-25 HISTORY — DX: Presence of cardiac pacemaker: Z95.0

## 2013-12-25 LAB — MDC_IDC_ENUM_SESS_TYPE_INCLINIC
Brady Statistic RA Percent Paced: 16 %
Brady Statistic RV Percent Paced: 100 %
Lead Channel Impedance Value: 490 Ohm
Lead Channel Pacing Threshold Amplitude: 0.6 V
Lead Channel Pacing Threshold Amplitude: 0.9 V
Lead Channel Sensing Intrinsic Amplitude: 3.5 mV
Lead Channel Sensing Intrinsic Amplitude: 9.5 mV
Lead Channel Setting Pacing Amplitude: 1.4 V
Lead Channel Setting Sensing Sensitivity: 2.5 mV
MDC IDC MSMT LEADCHNL RA IMPEDANCE VALUE: 590 Ohm
MDC IDC MSMT LEADCHNL RA PACING THRESHOLD PULSEWIDTH: 0.5 ms
MDC IDC MSMT LEADCHNL RV PACING THRESHOLD PULSEWIDTH: 0.5 ms
MDC IDC PG SERIAL: 112600
MDC IDC SESS DTM: 20150122050000
MDC IDC SET LEADCHNL RA PACING AMPLITUDE: 2.5 V
MDC IDC SET LEADCHNL RV PACING PULSEWIDTH: 0.4 ms
Zone Setting Detection Interval: 375 ms

## 2013-12-25 NOTE — Patient Instructions (Signed)
Your physician recommends that you continue on your current medications as directed. Please refer to the Current Medication list given to you today.  Remote monitoring is used to monitor your Pacemaker of ICD from home. This monitoring reduces the number of office visits required to check your device to one time per year. It allows us to keep an eye on the functioning of your device to ensure it is working properly. You are scheduled for a device check from home on 04/02/14. You may send your transmission at any time that day. If you have a wireless device, the transmission will be sent automatically. After your physician reviews your transmission, you will receive a postcard with your next transmission date.  Your physician wants you to follow-up in: September with Dr. Graciela HusbandsKlein.  You will receive a reminder letter in the mail two months in advance. If you don't receive a letter, please call our office to schedule the follow-up appointment.

## 2013-12-25 NOTE — Assessment & Plan Note (Signed)
Stable post pacing 

## 2013-12-25 NOTE — Assessment & Plan Note (Signed)
Much improved

## 2013-12-25 NOTE — Assessment & Plan Note (Signed)
The patient's device was interrogated and the information was fully reviewed.  The device was reprogrammed to  Maximize longevity 

## 2013-12-25 NOTE — Progress Notes (Signed)
pr     Patient Care Team: Laurell JosephsAaron P Morrow as PCP - General (Specialist)   HPI  Brian ShinBruce Parish is a 80 y.o. male Seen in followup for significant resting bradycardia exercise tolerance associated with abrupt bradycardia with 2:1 heart block  Exercise intolerance is much improved  Past Medical History  Diagnosis Date  . Asthma     No past surgical history on file.  Current Outpatient Prescriptions  Medication Sig Dispense Refill  . aspirin 81 MG tablet Take 81 mg by mouth daily.      . budesonide-formoterol (SYMBICORT) 80-4.5 MCG/ACT inhaler Inhale 2 puffs into the lungs 2 (two) times daily.  1 Inhaler  12  . methylcellulose (ARTIFICIAL TEARS) 1 % ophthalmic solution Place 1 drop into both eyes daily as needed (dry eyes).      . Omega-3 Fatty Acids (FISH OIL) 645 MG CAPS Take by mouth 2 (two) times daily.      . vitamin B-12 (CYANOCOBALAMIN) 1000 MCG tablet Take 1,000 mcg by mouth daily.       No current facility-administered medications for this visit.    No Known Allergies  Review of Systems negative except from HPI and PMH  Physical Exam BP 134/72  Pulse 63  Ht 5\' 10"  (1.778 m)  Wt 186 lb 12.8 oz (84.732 kg)  BMI 26.80 kg/m2 Well developed and well nourished in no acute distress HENT normal E scleral and icterus clear Neck Supple JVP flat; carotids brisk and full Clear to ausculation  Device pocket well healed; without hematoma or erythema.  There is suggestion of tethering Regular rate and rhythm, no murmurs gallops or rub Soft with active bowel sounds No clubbing cyanosis none Edema Alert and oriented, grossly normal motor and sensory function Skin Warm and Dry    Assessment and  Plan

## 2013-12-31 ENCOUNTER — Encounter: Payer: Self-pay | Admitting: Internal Medicine

## 2014-01-10 ENCOUNTER — Other Ambulatory Visit: Payer: Self-pay | Admitting: Internal Medicine

## 2014-04-02 ENCOUNTER — Ambulatory Visit (INDEPENDENT_AMBULATORY_CARE_PROVIDER_SITE_OTHER): Payer: Medicare Other | Admitting: *Deleted

## 2014-04-02 DIAGNOSIS — I498 Other specified cardiac arrhythmias: Secondary | ICD-10-CM

## 2014-04-02 DIAGNOSIS — I441 Atrioventricular block, second degree: Secondary | ICD-10-CM

## 2014-04-06 ENCOUNTER — Encounter: Payer: Self-pay | Admitting: Internal Medicine

## 2014-04-15 ENCOUNTER — Encounter: Payer: Self-pay | Admitting: *Deleted

## 2014-04-23 LAB — MDC_IDC_ENUM_SESS_TYPE_REMOTE
Brady Statistic RV Percent Paced: 100 %
Lead Channel Impedance Value: 464 Ohm
Lead Channel Sensing Intrinsic Amplitude: 4.7 mV
Lead Channel Setting Pacing Amplitude: 1.4 V
Lead Channel Setting Pacing Pulse Width: 0.4 ms
Lead Channel Setting Sensing Sensitivity: 2.5 mV
MDC IDC MSMT LEADCHNL RA IMPEDANCE VALUE: 635 Ohm
MDC IDC PG SERIAL: 112600
MDC IDC SET LEADCHNL RA PACING AMPLITUDE: 2.5 V
MDC IDC SET ZONE DETECTION INTERVAL: 375 ms
MDC IDC STAT BRADY RA PERCENT PACED: 16 %

## 2014-04-24 NOTE — Progress Notes (Signed)
Remote pacemaker transmission.   

## 2014-05-08 ENCOUNTER — Encounter: Payer: Self-pay | Admitting: Cardiology

## 2014-05-14 ENCOUNTER — Encounter: Payer: Self-pay | Admitting: Internal Medicine

## 2014-06-08 NOTE — Telephone Encounter (Signed)
Chart open and in error

## 2014-06-24 ENCOUNTER — Telehealth: Payer: Self-pay | Admitting: Internal Medicine

## 2014-06-24 NOTE — Telephone Encounter (Signed)
New message     Patient calling felt like he wanted to past out on yesterday.     Patient stating he will be doing a home transmitting this am.

## 2014-06-25 NOTE — Telephone Encounter (Addendum)
Patient tells me he experienced dizziness on Tuesday when going to the [employer], systolic BP there was 124. He further explains that he occassionally experiences dizziness but wanted to make sure it was related to nothing else. Pt sent transmission yesterday - review showed nothing on report. Pt states he feels fine now, and has not felt reoccurrence. Pt will call back if issue reoccurs.

## 2014-08-25 ENCOUNTER — Ambulatory Visit (INDEPENDENT_AMBULATORY_CARE_PROVIDER_SITE_OTHER): Payer: Medicare Other | Admitting: Internal Medicine

## 2014-08-25 ENCOUNTER — Encounter: Payer: Self-pay | Admitting: Internal Medicine

## 2014-08-25 VITALS — BP 134/80 | HR 73 | Ht 70.0 in | Wt 186.4 lb

## 2014-08-25 DIAGNOSIS — I441 Atrioventricular block, second degree: Secondary | ICD-10-CM

## 2014-08-25 DIAGNOSIS — Z950 Presence of cardiac pacemaker: Secondary | ICD-10-CM

## 2014-08-25 LAB — MDC_IDC_ENUM_SESS_TYPE_INCLINIC
Battery Remaining Longevity: 121 mo
Brady Statistic RA Percent Paced: 24 %
Brady Statistic RV Percent Paced: 100 %
Date Time Interrogation Session: 20150922040000
Lead Channel Impedance Value: 670 Ohm
Lead Channel Pacing Threshold Amplitude: 0.7 V
Lead Channel Sensing Intrinsic Amplitude: 12.9 mV
Lead Channel Sensing Intrinsic Amplitude: 4.4 mV
Lead Channel Setting Pacing Amplitude: 1.8 V
Lead Channel Setting Pacing Pulse Width: 0.4 ms
MDC IDC MSMT LEADCHNL RA PACING THRESHOLD PULSEWIDTH: 0.5 ms
MDC IDC MSMT LEADCHNL RV IMPEDANCE VALUE: 465 Ohm
MDC IDC MSMT LEADCHNL RV PACING THRESHOLD AMPLITUDE: 0.6 V
MDC IDC MSMT LEADCHNL RV PACING THRESHOLD PULSEWIDTH: 0.5 ms
MDC IDC PG SERIAL: 112600
MDC IDC SET LEADCHNL RA PACING AMPLITUDE: 2.5 V
MDC IDC SET LEADCHNL RV SENSING SENSITIVITY: 2.5 mV
Zone Setting Detection Interval: 375 ms

## 2014-08-25 NOTE — Patient Instructions (Addendum)
Your physician recommends that you continue on your current medications as directed. Please refer to the Current Medication list given to you today.  Remote monitoring is used to monitor your Pacemaker of ICD from home. This monitoring reduces the number of office visits required to check your device to one time per year. It allows [place] to keep an eye on the functioning of your device to ensure it is working properly. You are scheduled for a device check from home on 11/26/2014. You may send your transmission at any time that day. If you have a wireless device, the transmission will be sent automatically. After your physician reviews your transmission, you will receive a postcard with your next transmission date.  Your physician wants you to follow-up in: ONE YEAR WITH DR. Graciela Husbands. You will receive a reminder letter in the mail two months in advance. If you don't receive a letter, please call our office to schedule the follow-up appointment.

## 2014-08-25 NOTE — Progress Notes (Signed)
      Patient Care Team: Laurell Josephs, MD as PCP - General (Specialist)   HPI  Brian Barron is a 80 y.o. 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 It was further complicated by episodes of nonsustained ventricular tachycardia with multiple morphologies. Catheterization demonstrated normal LV function and nonobstructive coronary disease. He also has sinus bradycardia.  [name] He underwent implantation with a Medtronic device 9/14. It was associated with marked improvement in exercise tolerance  Past Medical History  Diagnosis Date  . Asthma   . Mobitz type I Wenckebach atrioventricular block 07/18/2013  . First degree AV block 07/18/2013  . Polymorphic ventricular tachycardia 08/01/2013  . Pacemaker--Medtronic 12/25/2013    History reviewed. No pertinent past surgical history.  Current Outpatient Prescriptions  Medication Sig Dispense Refill  . aspirin 81 MG tablet Take 81 mg by mouth daily.      . methylcellulose (ARTIFICIAL TEARS) 1 % ophthalmic solution Place 1 drop into both eyes daily as needed (dry eyes).      . Omega-3 Fatty Acids (FISH OIL) 645 MG CAPS Take by mouth 2 (two) times daily.      . SYMBICORT 80-4.5 MCG/ACT inhaler INHALE 2 PUFFS INTO LUNGS TWICE DAILY  1 Inhaler  2  . vitamin B-12 (CYANOCOBALAMIN) 1000 MCG tablet Take 1,000 mcg by mouth daily.       No current facility-administered medications for this visit.    No Known Allergies  Review of Systems negative except from HPI and PMH  Physical Exam BP 134/80  Pulse 73  Ht  (1.778 m)  Wt 186 lb 6.4 oz (84.55 kg)  BMI 26.75 kg/m2 Well developed and well nourished in no acute distress HENT normal E scleral and icterus clear Neck Supple JVP flat; carotids brisk and full Clear to ausculation Device pocket well healed; without hematoma or erythema.  There is no tethering  r egular rate and rhythm, no murmurs gallops or  rub Soft with active bowel sounds No clubbing cyanosis  Edema Alert and oriented, grossly normal motor and sensory function Skin Warm and Dry    Assessment and  Plan  Sinus Bradycardia  Pacemaker-Medtronic  The patient's device was interrogated.  The information was reviewed. No changes were made in the programming.    VT nonsustained  No intercurrent Ventricular tachycardia  Pt stable on current meds

## 2014-08-26 ENCOUNTER — Encounter: Payer: Self-pay | Admitting: Internal Medicine

## 2014-11-12 ENCOUNTER — Encounter (HOSPITAL_COMMUNITY): Payer: Self-pay | Admitting: Internal Medicine

## 2014-11-26 ENCOUNTER — Ambulatory Visit (INDEPENDENT_AMBULATORY_CARE_PROVIDER_SITE_OTHER): Payer: Medicare Other | Admitting: *Deleted

## 2014-11-26 DIAGNOSIS — R001 Bradycardia, unspecified: Secondary | ICD-10-CM

## 2014-11-26 LAB — MDC_IDC_ENUM_SESS_TYPE_REMOTE
Battery Remaining Longevity: 132 mo
Brady Statistic RA Percent Paced: 30 %
Brady Statistic RV Percent Paced: 100 %
Lead Channel Pacing Threshold Amplitude: 0.7 V
Lead Channel Setting Pacing Amplitude: 1.9 V
Lead Channel Setting Pacing Amplitude: 2.5 V
Lead Channel Setting Pacing Pulse Width: 0.4 ms
Lead Channel Setting Sensing Sensitivity: 2.5 mV
MDC IDC MSMT BATTERY REMAINING PERCENTAGE: 100 %
MDC IDC MSMT LEADCHNL RA IMPEDANCE VALUE: 610 Ohm
MDC IDC MSMT LEADCHNL RA PACING THRESHOLD PULSEWIDTH: 0.5 ms
MDC IDC MSMT LEADCHNL RV IMPEDANCE VALUE: 432 Ohm
MDC IDC MSMT LEADCHNL RV PACING THRESHOLD AMPLITUDE: 1.4 V
MDC IDC MSMT LEADCHNL RV PACING THRESHOLD PULSEWIDTH: 0.4 ms
MDC IDC PG SERIAL: 112600
MDC IDC SESS DTM: 20151224140100
Zone Setting Detection Interval: 375 ms

## 2014-11-30 NOTE — Progress Notes (Signed)
Remote pacemaker transmission.   

## 2014-12-18 ENCOUNTER — Encounter: Payer: Self-pay | Admitting: Cardiology

## 2014-12-23 ENCOUNTER — Encounter: Payer: Self-pay | Admitting: Internal Medicine

## 2015-01-05 ENCOUNTER — Encounter: Payer: Self-pay | Admitting: Pulmonary Disease

## 2015-01-05 ENCOUNTER — Ambulatory Visit (INDEPENDENT_AMBULATORY_CARE_PROVIDER_SITE_OTHER): Payer: Medicare Other | Admitting: Pulmonary Disease

## 2015-01-05 VITALS — BP 130/72 | HR 67 | Temp 98.0°F | Ht 70.0 in | Wt 189.6 lb

## 2015-01-05 DIAGNOSIS — G4700 Insomnia, unspecified: Secondary | ICD-10-CM

## 2015-01-05 NOTE — Patient Instructions (Signed)
Will try you on requip 0.5mg  one after dinner each night for next 3 weeks to see if helps sleep No personal electronics after 10pm No reading or tv watching in the bedroom while still having issues with sleep If you awaken and cannot get back to sleep within , you need to leave the bedroom and read or watch TV.  Do nothing active.  Once sleepy again, try going back to bedroom.  Do this as many times as it takes until you fall asleep or 8 am comes. Please call me in 3 weeks with update on how things are going.  If still having issues, we can consider a sleep study vs getting you to a behavioral specialist for CBT (cognitive behavioral therapy)

## 2015-01-05 NOTE — Assessment & Plan Note (Signed)
The patient has difficulty initiating sleep that he has overcome by taking Benadryl and also melatonin. However, he has frequent awakenings at night, and once he awakens around 4 AM, he is unable to return to sleep. There is really nothing to suggest a sleep disorder such as obstructive sleep apnea, but I cannot exclude 100%. He does have a history that is suggestive of the restless leg syndrome, and perhaps it would be worth a trial of a dopamine agonist to see if this helps. Overall, I suspect that behavioral therapy is going to help him the most. I have asked him to avoid personal electronics after 10 AM, and to use the bedroom only for sleep. I have outlined stimulus control therapy to see if this will help. If he continues to have issues, he will need referral to a psychologist for cognitive behavioral therapy.

## 2015-01-05 NOTE — Progress Notes (Signed)
Subjective:    Patient ID: Brian Barron, male    DOB: 1931/07/29, 81 y.o.   MRN: [id number]  HPI The patient is an 81 year old male who comes in today as a self-referral for ongoing sleep issues. The patient has had trouble with sleep onset in the past, for which he takes Benadryl and also melatonin. This has resolved his sleep onset issue, and he will typically awaken around 1:00 but gets back to sleep quickly. He will then have multiple awakenings and what he describes as restless sleep, but once he awakens around 4 AM, he is unable to get back to sleep. He will then often read in bed, but often is unsuccessful at returning to sleep. He does describe classic RLS symptoms in the early evening, and this is especially bad on long plane flights. His wife has not said anything about snoring or an abnormal breathing pattern during sleep, and is unsure if he kicks during the night. When the patient awakens at 4 AM, his mind starts racing and he is unable to initiate sleep. During the day he does not drink caffeine, but will nap about 3-4 days out of 7. He does get sleepy during the day at times especially with television, and his Epworth score today is 10. The patient states that his weight is the same over the last 2 years.   Sleep Questionnaire What time do you typically go to bed?( Between what hours) 10-11 10-11 at 1342 on 01/05/15 by Tommie SamsMindy S Silva, CMA How long does it take you to fall asleep? 45 min 45 min at 1342 on 01/05/15 by Tommie SamsMindy S Silva, CMA How many times during the night do you wake up? 2 2 at 1342 on 01/05/15 by Tommie SamsMindy S Silva, CMA What time do you get out of bed to start your day? 0800 0800 at 1342 on 01/05/15 by Tommie SamsMindy S Silva, CMA Do you drive or operate heavy machinery in your occupation? No No at 1342 on 01/05/15 by Tommie SamsMindy S Silva, CMA How much has your weight changed (up or down) over the past two years? (In pounds) 0 oz (0 kg) 0 oz (0 kg) at 1342 on 01/05/15 by Tommie SamsMindy S  Silva, CMA Have you ever had a sleep study before? No No at 1342 on 01/05/15 by Tommie SamsMindy S Silva, CMA Do you currently use CPAP? No No at 1342 on 01/05/15 by Tommie SamsMindy S Silva, CMA Do you wear oxygen at any time? No    Review of Systems  Constitutional: Negative for fever and unexpected weight change.  HENT: Negative for congestion, dental problem, ear pain, nosebleeds, postnasal drip, rhinorrhea, sinus pressure, sneezing, sore throat and trouble swallowing.   Eyes: Negative for redness and itching.  Respiratory: Positive for shortness of breath. Negative for cough, chest tightness and wheezing.   Cardiovascular: Positive for palpitations. Negative for leg swelling.  Gastrointestinal: Negative for nausea and vomiting.  Genitourinary: Negative for dysuria.  Musculoskeletal: Negative for joint swelling.  Skin: Negative for rash.  Neurological: Negative for headaches.  Hematological: Does not bruise/bleed easily.  Psychiatric/Behavioral: Negative for dysphoric mood. The patient is nervous/anxious.        Objective:   Physical Exam Constitutional:  Well developed, no acute distress  HENT:  Nares patent without discharge  Oropharynx without exudate, palate and uvula are mildly elongated.  Eyes:  Perrla, eomi, no scleral icterus  Neck:  No JVD, no TMG  Cardiovascular:  Normal rate, regular rhythm, no rubs or gallops.  No murmurs  Intact distal pulses  Pulmonary :  Normal breath sounds, no stridor or respiratory distress   No rales, rhonchi, or wheezing  Abdominal:  Soft, nondistended, bowel sounds present.  No tenderness noted.   Musculoskeletal:  No lower extremity edema noted.  Lymph Nodes:  No cervical lymphadenopathy noted  Skin:  No cyanosis noted  Neurologic:  Alert, appropriate, moves all 4 extremities without obvious deficit.         Assessment & Plan:

## 2015-01-06 ENCOUNTER — Telehealth: Payer: Self-pay | Admitting: Pulmonary Disease

## 2015-01-06 MED ORDER — ROPINIROLE HCL 0.5 MG PO TABS: ORAL_TABLET | ORAL | Status: DC

## 2015-01-06 NOTE — Telephone Encounter (Signed)
Pt aware RX sent in. Nothing further needed 

## 2015-01-25 ENCOUNTER — Telehealth: Payer: Self-pay | Admitting: Pulmonary Disease

## 2015-01-25 DIAGNOSIS — G4700 Insomnia, unspecified: Secondary | ICD-10-CM

## 2015-01-25 NOTE — Telephone Encounter (Signed)
lmomtcb x1 for Dr. Kateri PlummerMorrow nurse

## 2015-01-25 NOTE — Telephone Encounter (Signed)
Brian Barron, please call this pt's primary care md office and ask to speak to his nurse Let them know we are adequately treating his leg movements, and the pt is still having problems with insomnia.  Would recommend that he be referred to a behavioral psychologist for CBT (cognitive behavioral therapy), and would see if Dr. Kateri PlummerMorrow can do this.  Thanks.

## 2015-01-25 NOTE — Telephone Encounter (Signed)
Spoke with pt. States that Requip is helping but he's only sleeping for 4 hours. He will wake up and then has a hard time going back to sleep. Has tried taking Benadryl with Requip but it has not helped much. Would like KC's recommendations.  KC - please advise. Thanks.

## 2015-01-25 NOTE — Telephone Encounter (Signed)
See if he is still having leg symptoms despite being on requip (is he still having breakthru though he may be better?) If so, we can increase his requip dose to 2 after dinner each night. If not having breakthru symptoms, we had discussed seeing a behavioral therapist to help him with the insomnia.  I can send a note to his primary asking him to refer pt to behavioral psychologist.

## 2015-01-25 NOTE — Telephone Encounter (Signed)
Pt is not having breakthrough symptoms. He has agreed to go see the behavorial therapist.

## 2015-01-26 NOTE — Telephone Encounter (Signed)
Dr. Vincente LibertyMorrow's nurse called back and made her aware of below.  She will send message over to Dr. Kateri PlummerMorrow and see if he is okay with this.  She will call us back to let us know.

## 2015-01-27 MED ORDER — ROPINIROLE HCL 0.5 MG PO TABS: ORAL_TABLET | ORAL | Status: AC

## 2015-01-27 NOTE — Telephone Encounter (Signed)
ATC Dr Vincente LibertyMorrow's office to speak with McKenzie, NA, rang several times. WCB

## 2015-01-27 NOTE — Telephone Encounter (Signed)
Pt is aware of KC's recommendations. He would like to proceed with sleep study. Order will be placed for sleep study. Rx will be sent in.

## 2015-01-27 NOTE — Telephone Encounter (Signed)
Mckenzie from Dr. Brandon MelnickMorrows office cb to let us know pt is states he is not going to do psychologist first he wants to do sleep first. [phone]

## 2015-01-27 NOTE — Telephone Encounter (Signed)
Calling back about his sleep also wants scrip for requip [phone] pt

## 2015-01-27 NOTE — Telephone Encounter (Signed)
Spoke with McKenzie at Dr Vincente LibertyMorrow's office. States that they have a list of Psychologists that they send patients to -- they do not take referrals, the patient will be given the information and phone numbers and will be responsible for calling to set up the appt. McKenzie states that the patient actually called them yesterday asking about a referral to a Psychologist and then mentioned having a sleep study instead -- she states that she will call the patient back to discuss with him the different options for psychologists and also will discuss if he wishes to have the sleep study instead. I advised McKenzie that if the patient is not wanting to see the Psychologist then he will need to call our office to set up sleep study if he wishes to take that route.  McKenzie states that she will let us know what he decides.  Will send to KC/Mindy as Lorain ChildesFYI

## 2015-01-27 NOTE — Telephone Encounter (Signed)
Ok to fill requip with 6 fills. Let pt know I am happy to do a sleep study for further evaluation, but if he is not able to sleep much during the night it may not be helpful.  If he wishes to proceed, ok to order SLE3, and I will call him with results.

## 2015-01-27 NOTE — Telephone Encounter (Signed)
lmomtcb x1 for [facility]ckenzie

## 2015-01-27 NOTE — Telephone Encounter (Signed)
lmtcb x2 for [employer]Kenzie.

## 2015-01-27 NOTE — Telephone Encounter (Signed)
McKenzie, Dr Vincente LibertyMorrow's nurse, returned call. Ph # [phone] (direct line to her)

## 2015-01-27 NOTE — Telephone Encounter (Signed)
mckenzie returning call.Caren GriffinsStanley A Dalton

## 2015-01-28 ENCOUNTER — Ambulatory Visit (HOSPITAL_BASED_OUTPATIENT_CLINIC_OR_DEPARTMENT_OTHER): Payer: Medicare Other | Attending: Pulmonary Disease | Admitting: *Deleted

## 2015-01-28 VITALS — Ht 70.0 in | Wt 182.0 lb

## 2015-01-28 DIAGNOSIS — G4700 Insomnia, unspecified: Secondary | ICD-10-CM

## 2015-01-28 DIAGNOSIS — G4733 Obstructive sleep apnea (adult) (pediatric): Secondary | ICD-10-CM | POA: Insufficient documentation

## 2015-01-28 DIAGNOSIS — G4710 Hypersomnia, unspecified: Secondary | ICD-10-CM | POA: Diagnosis present

## 2015-02-02 ENCOUNTER — Other Ambulatory Visit: Payer: Self-pay | Admitting: Pulmonary Disease

## 2015-02-04 ENCOUNTER — Telehealth: Payer: Self-pay | Admitting: Pulmonary Disease

## 2015-02-04 NOTE — Telephone Encounter (Signed)
Spoke with pt and advised that sleep study results are not back yet.  Advised to call back first of next week to see if results are back.  Pt verbalized understanding.

## 2015-02-05 ENCOUNTER — Ambulatory Visit: Payer: Medicare Other | Admitting: Emergency Medicine

## 2015-02-08 ENCOUNTER — Telehealth: Payer: Self-pay | Admitting: Pulmonary Disease

## 2015-02-08 NOTE — Telephone Encounter (Signed)
Call[facility] and it can take up to 2 weeks for results. [facility] to [facility] as an [facility]I pt calling for results

## 2015-02-10 DIAGNOSIS — G4733 Obstructive sleep apnea (adult) (pediatric): Secondary | ICD-10-CM

## 2015-02-10 NOTE — Progress Notes (Signed)
Called pt and appt scheduled for tomorrow 

## 2015-02-10 NOTE — Sleep Study (Signed)
   NAME: Brian Barron DATE OF BIRTH:  1931/11/04 MEDICAL RECORD NUMBER [id number]  LOCATION: [facility]  PHYSICIAN: Barbaraann ShareCLANCE,Arilyn Brierley M  DATE OF STUDY: 01/28/2015  SLEEP STUDY TYPE: Nocturnal Polysomnogram               REFERRING PHYSICIAN: [name], [name], MD  INDICATION FOR STUDY: Hypersomnia with sleep apnea  EPWORTH SLEEPINESS SCORE:  9 HEIGHT: 5\' 10"  (177.8 cm)  WEIGHT: 182 lb (82.555 kg)    Body mass index is 26.11 kg/(m^2).  NECK SIZE: 16 in.  MEDICATIONS: Reviewed in the sleep record  SLEEP ARCHITECTURE: The patient had a total sleep time of 161 minutes, with very little slow-wave sleep and only 20 minutes of REM. Sleep onset latency was normal at 15 minutes, and REM onset was normal at 81 minutes. Sleep efficiency was poor at 44%.  RESPIRATORY DATA: The patient was found to have 19 obstructive apneas and 12 hypopneas, giving him an AHI of 12 events per hour. The events occurred in all body positions, and there was loud snoring noted throughout.  OXYGEN DATA: The patient had oxygen desaturation as low as 87% with his obstructive events  CARDIAC DATA: No clinically significant cardiac arrhythmias were seen  MOVEMENT/PARASOMNIA: The patient had no significant periodic limb movements, and no abnormal behaviors noted.  IMPRESSION/ RECOMMENDATION:    1) mild obstructive sleep apnea/hypopnea syndrome, with an AHI of 12 events per hour, and oxygen desaturation as low as 87%. Treatment for this degree of sleep apnea can include a trial of weight loss alone if applicable, upper airway surgery, dental appliance, and also CPAP. Clinical correlation is suggested.    [name],Astraea Gaughran M Diplomate, American Board of Sleep Medicine  ELECTRONICALLY SIGNED ON:  02/10/2015, 8:59 AM  [facility] PH: [phone]   FX: [phone] ACCREDITED BY THE AMERICAN ACADEMY OF SLEEP MEDICINE

## 2015-02-10 NOTE — Telephone Encounter (Signed)
Kc please advise on sleep study results.  Thanks!

## 2015-02-10 NOTE — Progress Notes (Signed)
Pt needs ov to review his sleep study 

## 2015-02-11 ENCOUNTER — Ambulatory Visit (INDEPENDENT_AMBULATORY_CARE_PROVIDER_SITE_OTHER): Payer: Medicare Other | Admitting: Pulmonary Disease

## 2015-02-11 ENCOUNTER — Encounter: Payer: Self-pay | Admitting: Pulmonary Disease

## 2015-02-11 VITALS — BP 136/72 | HR 63 | Temp 97.0°F | Ht 70.0 in | Wt 189.0 lb

## 2015-02-11 DIAGNOSIS — G4733 Obstructive sleep apnea (adult) (pediatric): Secondary | ICD-10-CM

## 2015-02-11 DIAGNOSIS — G4700 Insomnia, unspecified: Secondary | ICD-10-CM

## 2015-02-11 DIAGNOSIS — G2581 Restless legs syndrome: Secondary | ICD-10-CM | POA: Insufficient documentation

## 2015-02-11 MED ORDER — TRAZODONE HCL 50 MG PO TABS
50.0000 mg | ORAL_TABLET | Freq: Every day | ORAL | Status: DC
Start: 2015-02-11 — End: 2015-03-11

## 2015-02-11 NOTE — Assessment & Plan Note (Signed)
The patient has mild obstructive sleep apnea by his recent sleep study, and it is unclear if this has anything to do with his sleep issues. I have explained to him the only way to know for sure is to give him a trial of treatment with C Pap. He is agreeable to this, but would like to wait until he gets back from his vacation.

## 2015-02-11 NOTE — Assessment & Plan Note (Signed)
The patient has been started on a dopamine agonist for his RLS symptoms, and sees a definite improvement in his sleep. I have asked him to continue on this.

## 2015-02-11 NOTE — Progress Notes (Signed)
   Subjective:    Patient ID: Brian Barron, male    DOB: 1931/10/25, 81 y.o.   MRN: [id number]  HPI The patient comes in today for follow-up of his multiple sleep issues. He has a long history of chronic insomnia, but he also had a history consistent with the restless leg syndrome. He was started on Requip at the last visit, and has seen significant improvement in his leg symptoms. This has translated into some improvement in sleep, but he continues to have periods of sleeplessness during the night. He underwent a sleep study recently that did show mild obstructive sleep apnea, with an AHI of 12 events per hour. He had excellent control of his periodic limb movements on his dopamine agonist.  I have reviewed the study with him in detail, and answered all of his questions.   Review of Systems  Constitutional: Negative for fever and unexpected weight change.  HENT: Negative for congestion, dental problem, ear pain, nosebleeds, postnasal drip, rhinorrhea, sinus pressure, sneezing, sore throat and trouble swallowing.   Eyes: Negative for redness and itching.  Respiratory: Negative for cough, chest tightness, shortness of breath and wheezing.   Cardiovascular: Negative for palpitations and leg swelling.  Gastrointestinal: Negative for nausea and vomiting.  Genitourinary: Negative for dysuria.  Musculoskeletal: Negative for joint swelling.  Skin: Negative for rash.  Neurological: Negative for headaches.  Hematological: Does not bruise/bleed easily.  Psychiatric/Behavioral: Negative for dysphoric mood. The patient is not nervous/anxious.        Objective:   Physical Exam Well-developed male in no acute distress Nose without purulence or discharge noted Neck without lymphadenopathy or thyromegaly Lower extremities without edema, no cyanosis Alert and oriented, moves all 4 extremities.       Assessment & Plan:

## 2015-02-11 NOTE — Assessment & Plan Note (Signed)
The patient continues to have insomnia issues at night, and I've asked him to stick with the behavioral therapies that I have discussed with him. We will see how he responds to the C Pap trial, but I suspect ultimately he will need cognitive behavioral therapy with a psychologist. I am willing to give him a short trial of trazodone since he is going out of the country, and told he can get back and try the C Pap. At least this will not build tolerance, and is not habit forming.

## 2015-02-11 NOTE — Patient Instructions (Signed)
Stay on requip as directed. Will try trazodone 50mg  at bedtime while you are out of the country. Please call us when you get back, and we can initiate a trial of cpap.

## 2015-02-14 IMAGING — CR DG RIBS W/ CHEST 3+V*L*
4 series · 4 of 4 positions shown · non-contrast
Comparison: Chest x-ray of 04/14/2008

CLINICAL DATA: Left rib and flank pain

EXAM:
LEFT RIBS AND CHEST - 3+ VIEW

[view not recorded (1 of 4)]
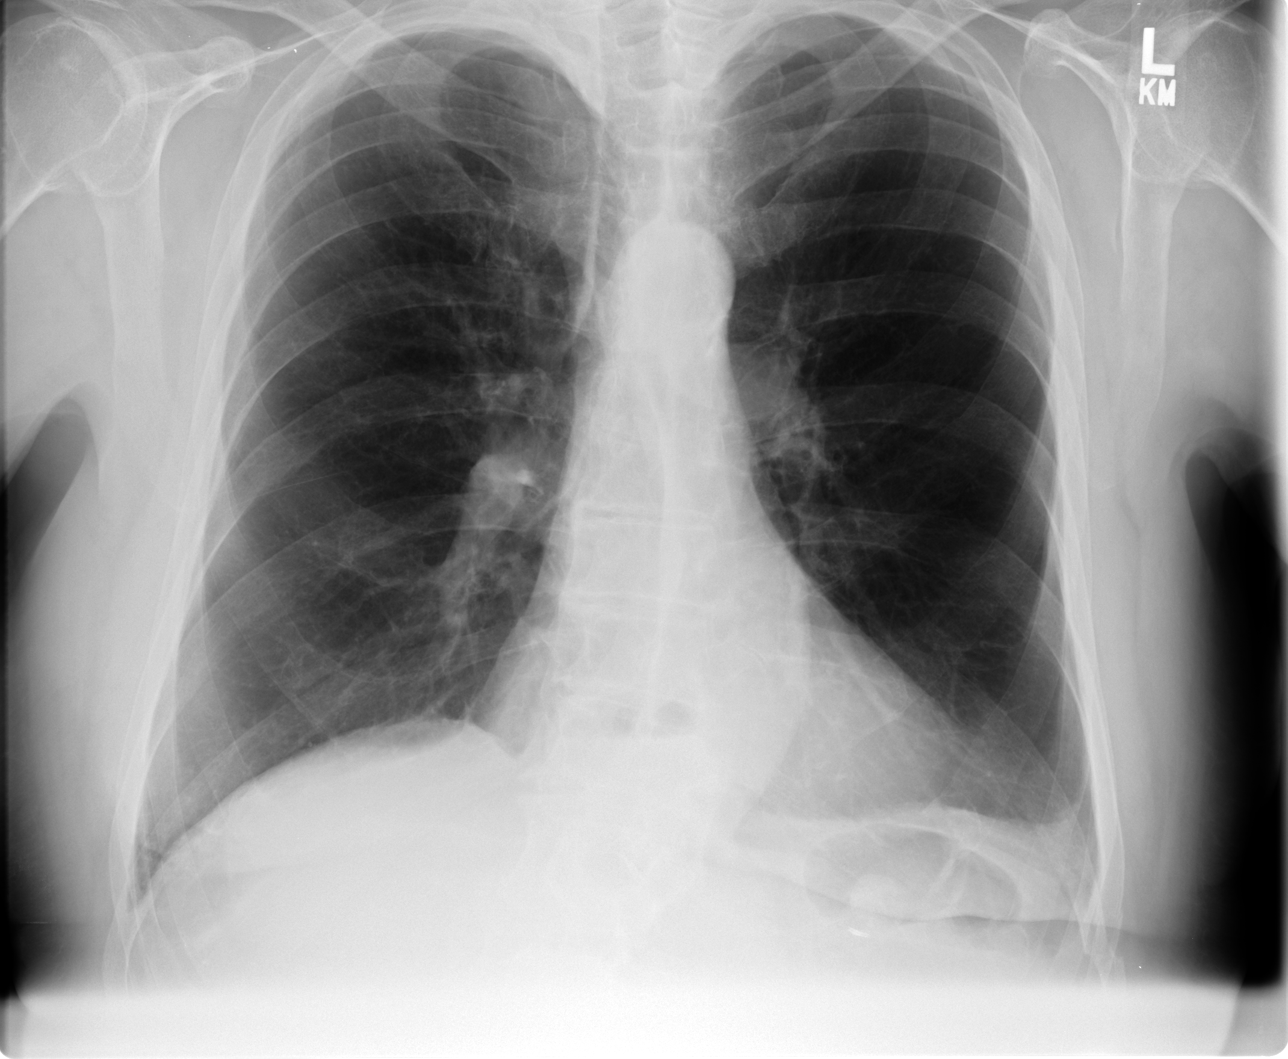

[view not recorded (2 of 4)]
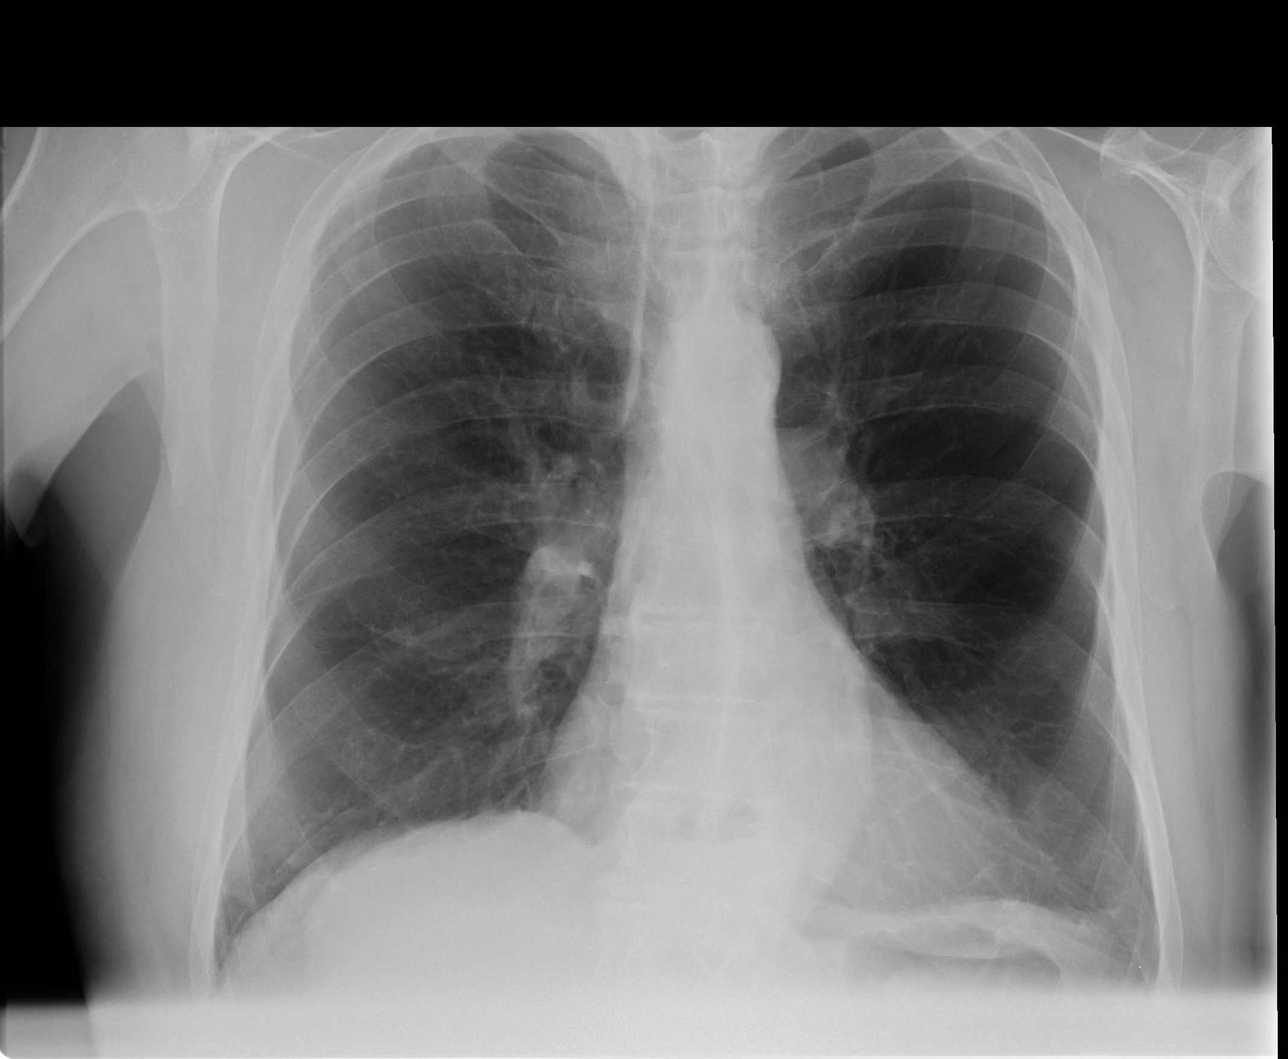

[view not recorded (3 of 4)]
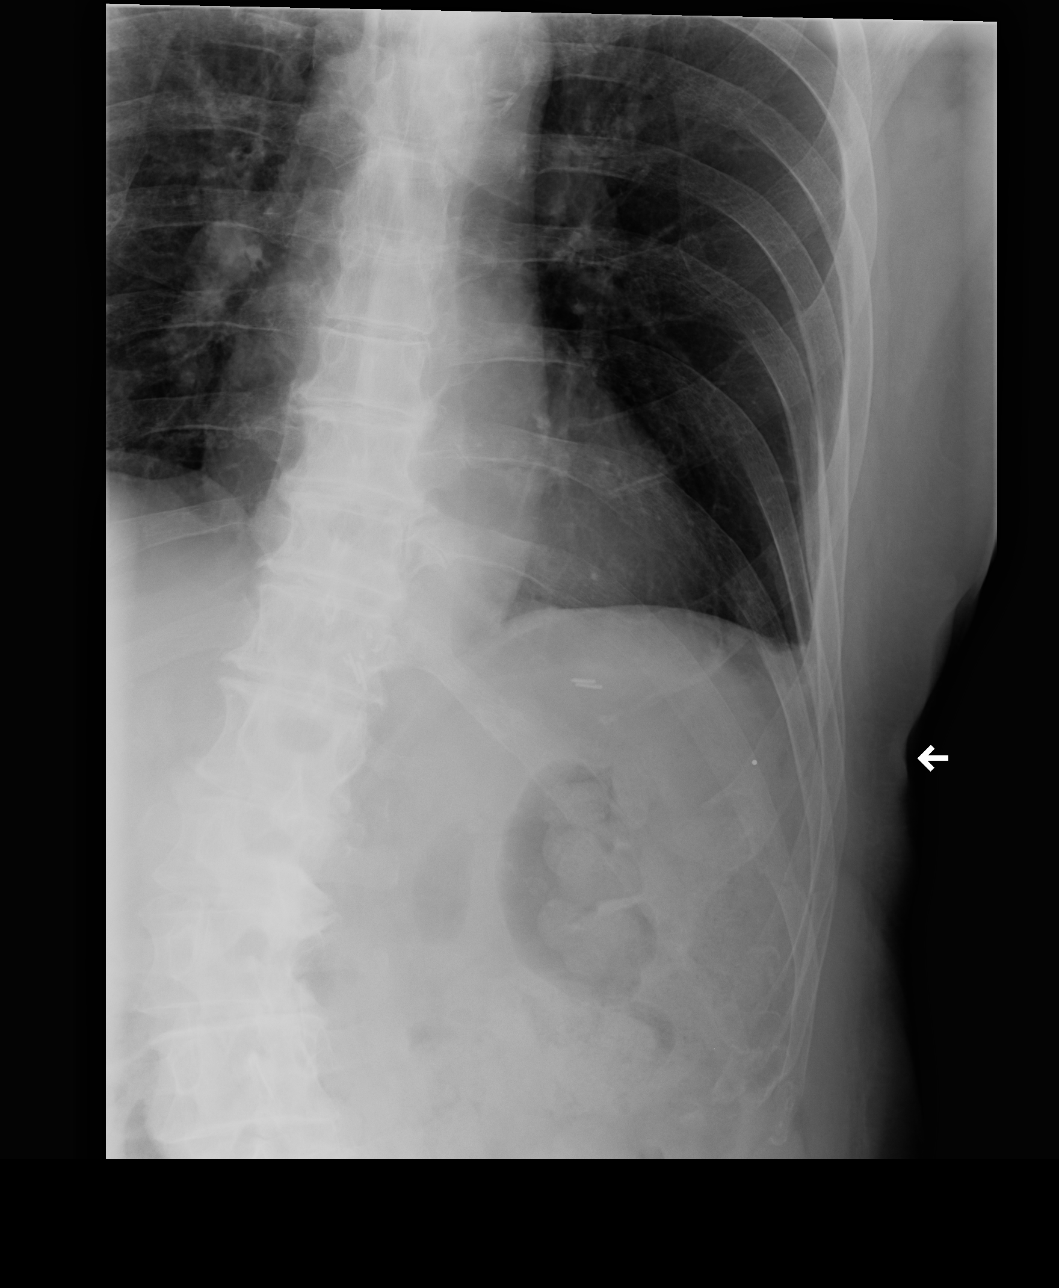

[view not recorded (4 of 4)]
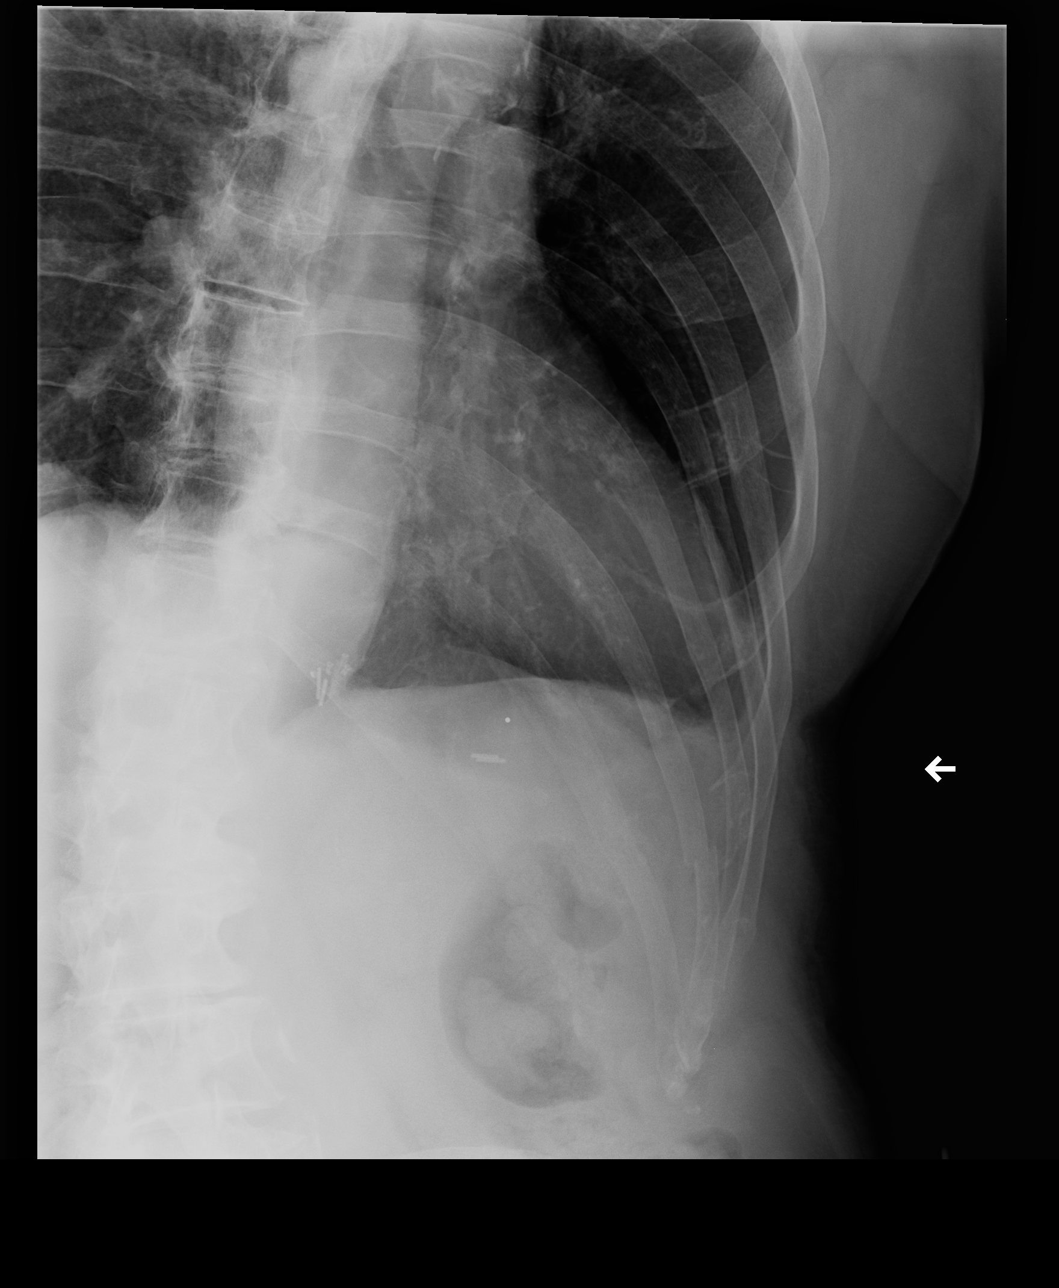

[4 of 4 positions shown; findings below may reference images not displayed]

FINDINGS: No active infiltrate is seen. There is some blunting of the left
costophrenic angle which may be due to scarring, but a small
effusion cannot be excluded. A small hiatal hernia is noted. The
heart is mildly enlarged and stable.

Left rib detail fail show acute fractures of the left anterior
-lateral 9th, 10th, and 11th ribs. No pneumothorax is seen.
IMPRESSION: 1. Acute fractures of the left antral lateral 9th, 10th, and 11th
ribs.
2. Probable small left pleural effusion. No pneumothorax.

## 2015-02-18 ENCOUNTER — Encounter: Payer: Self-pay | Admitting: Pulmonary Disease

## 2015-02-18 ENCOUNTER — Telehealth: Payer: Self-pay | Admitting: Pulmonary Disease

## 2015-02-18 NOTE — Telephone Encounter (Signed)
Called pt to get more details  Had to [facility]

## 2015-02-18 NOTE — Telephone Encounter (Signed)
Pt states that he is leaving for a [place] and will not be back home until April 4th. Pt wanting to touch base with Dr Shelle Ironlance and let him know that he is very interested in starting CPAP when he gets back. Pt states that he should be able to be reached while on the Cruise if someone needs to reach him to set something up.  Will send to Dr Shelle Ironlance as Lorain ChildesFYI that patient wishes to start CPAP April 5th.  [facility]. Thanks.

## 2015-02-18 NOTE — Telephone Encounter (Signed)
Spoke with pt- see telephone note 02/18/15  Nothing further needed.

## 2015-02-18 NOTE — Telephone Encounter (Signed)
Pt just needs to call us when he gets back from the trip.

## 2015-02-19 NOTE — Telephone Encounter (Signed)
Called made pt aware. He will call once he returns. Nothing further needed

## 2015-02-22 IMAGING — CR DG CHEST 2V
2 series · 2 of 2 positions shown · non-contrast
Comparison: 08/25/2013; 04/14/2008; CT abdomen and pelvis -
02/27/2008

CLINICAL DATA: Post pacemaker implantation

CHEST - 2 VIEW

[w chest pa]
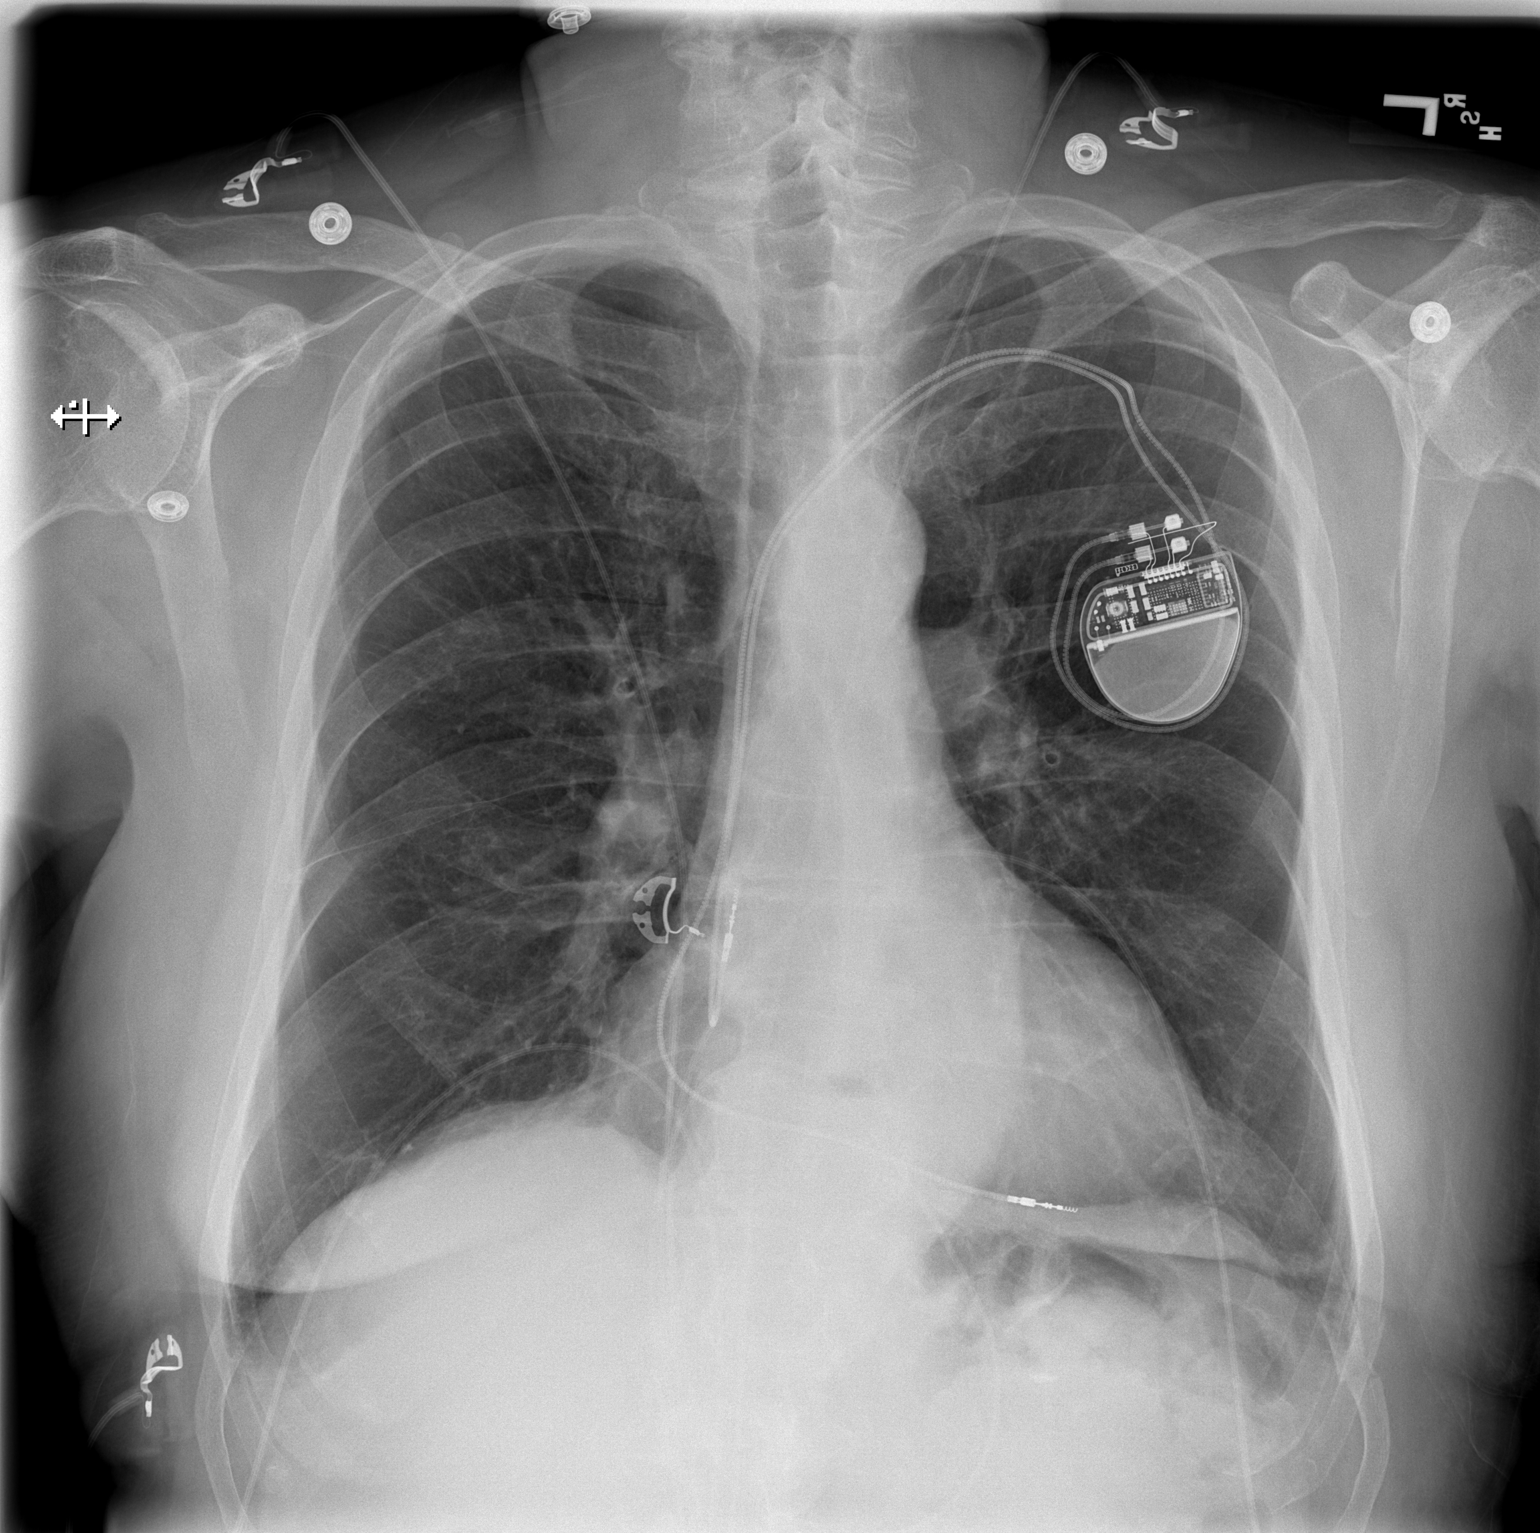

[w chest lat]
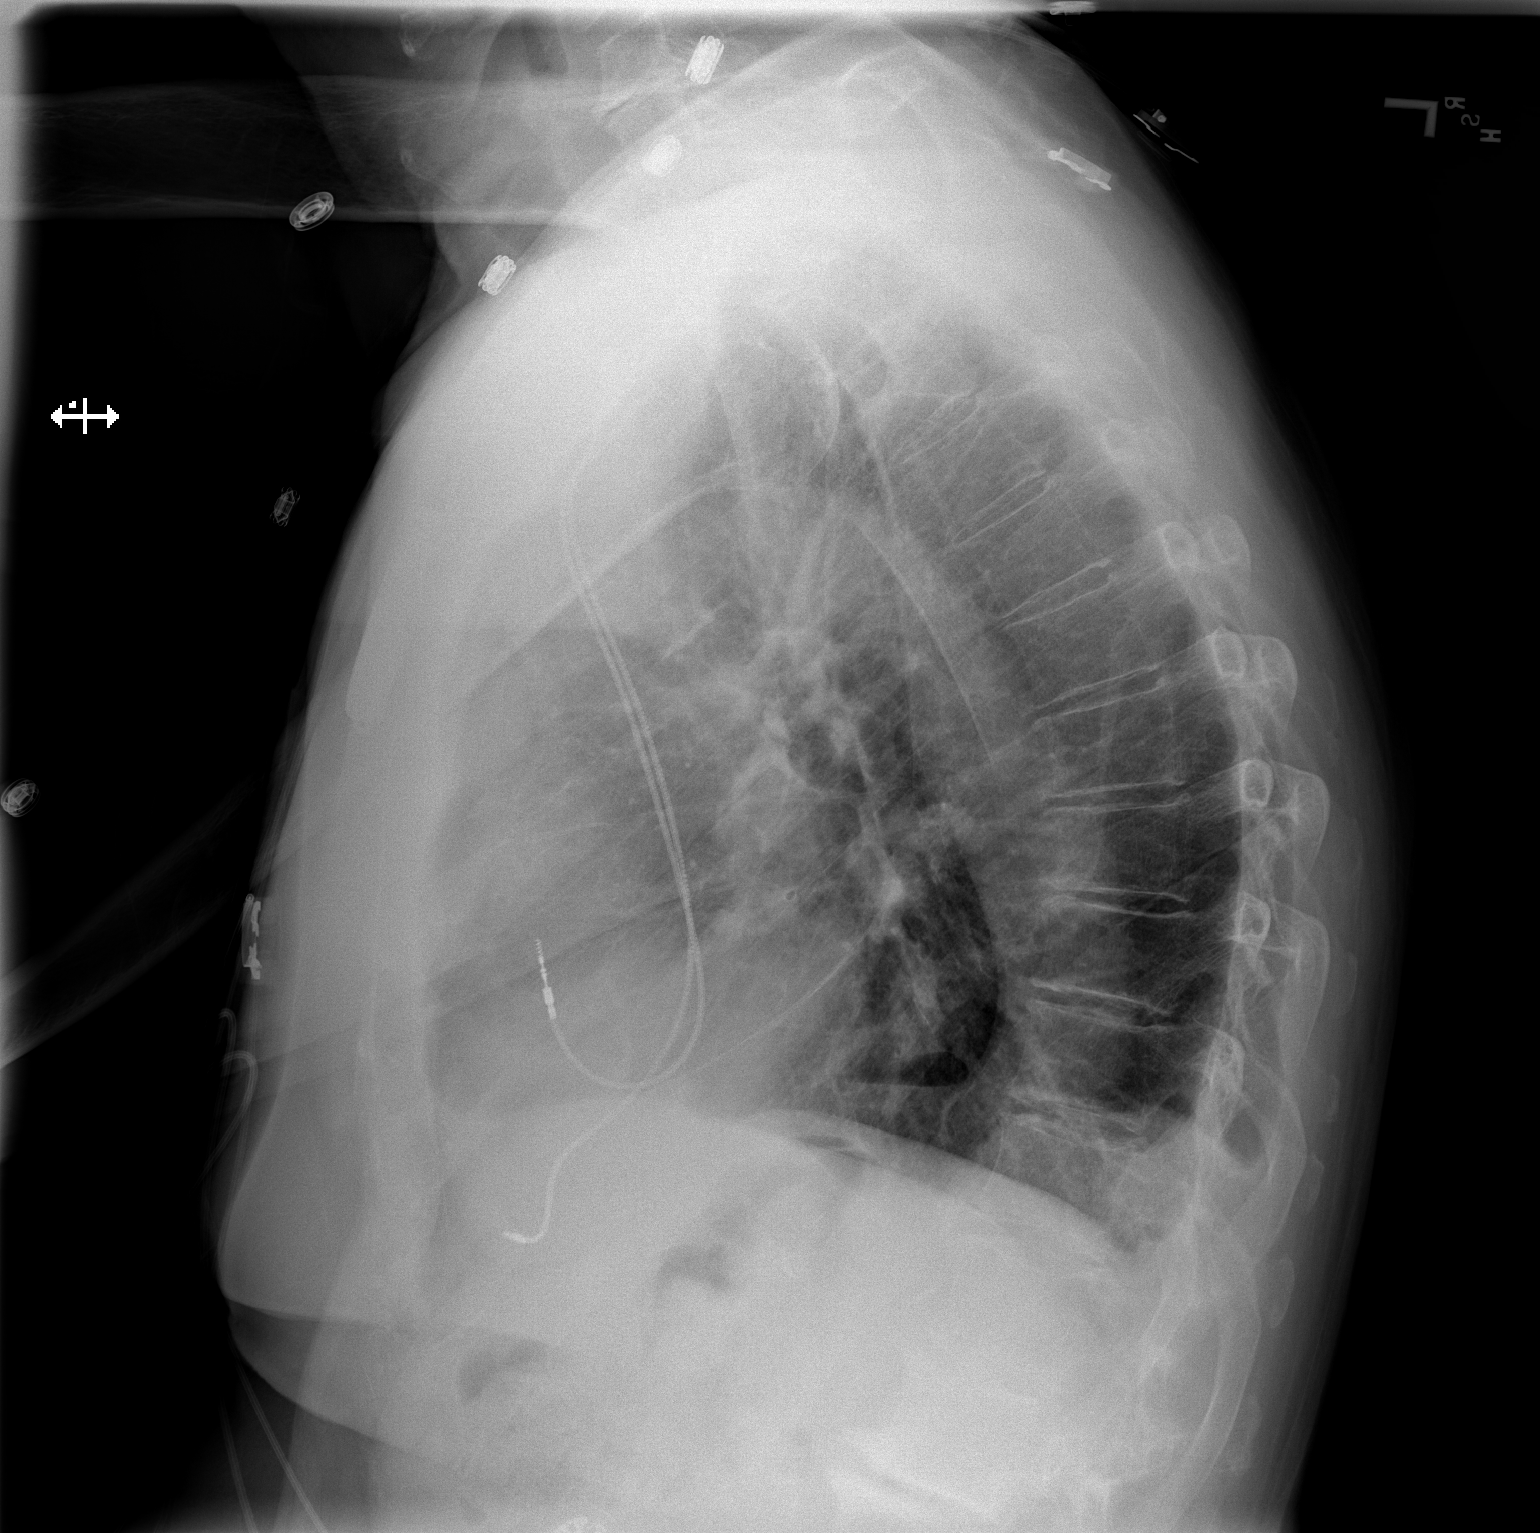

[2 of 2 positions shown; findings below may reference images not displayed]

FINDINGS: Grossly unchanged cardiac silhouette and mediastinal contours with
mild tortuosity of the descending thoracic aorta.  Interval
placement of left anterior chest wall dual lead pacemaker with lead
tips overlying the expected location of the right atrium and
ventricle.  Grossly unchanged retrocardiac air and fluid containing
structure compatible with a hiatal hernia.  The lungs remain
hyperexpanded with flattening of bilateral main diaphragms and
minimal bibasilar linear opacities favored to represent
atelectasis.  No new focal airspace opacities.  No pleural effusion
or pneumothorax.  No evidence of edema.  No pneumothorax.  Grossly
unchanged bones.
IMPRESSION: 1.  Interval placement of left anterior chest wall dual lead
pacemaker without evidence of complication.

2.  Hiatal hernia.

## 2015-03-09 ENCOUNTER — Ambulatory Visit (INDEPENDENT_AMBULATORY_CARE_PROVIDER_SITE_OTHER): Payer: Medicare Other | Admitting: *Deleted

## 2015-03-09 ENCOUNTER — Telehealth: Payer: Self-pay | Admitting: Cardiology

## 2015-03-09 ENCOUNTER — Other Ambulatory Visit: Payer: Self-pay | Admitting: Internal Medicine

## 2015-03-09 DIAGNOSIS — I441 Atrioventricular block, second degree: Secondary | ICD-10-CM | POA: Diagnosis not present

## 2015-03-09 NOTE — Telephone Encounter (Signed)
Spoke with pt and reminded pt of remote transmission that is due today. Pt verbalized understanding.   

## 2015-03-09 NOTE — Progress Notes (Signed)
Remote pacemaker transmission.   

## 2015-03-10 ENCOUNTER — Other Ambulatory Visit: Payer: Self-pay | Admitting: Pulmonary Disease

## 2015-03-10 ENCOUNTER — Encounter: Payer: Self-pay | Admitting: Emergency Medicine

## 2015-03-10 ENCOUNTER — Telehealth: Payer: Self-pay | Admitting: Pulmonary Disease

## 2015-03-10 DIAGNOSIS — G4733 Obstructive sleep apnea (adult) (pediatric): Secondary | ICD-10-CM

## 2015-03-10 LAB — MDC_IDC_ENUM_SESS_TYPE_REMOTE
Battery Remaining Longevity: 126 mo
Battery Remaining Percentage: 100 %
Brady Statistic RA Percent Paced: 25 %
Date Time Interrogation Session: 20160405155500
Implantable Pulse Generator Serial Number: 112600
Lead Channel Impedance Value: 669 Ohm
Lead Channel Pacing Threshold Amplitude: 1 V
Lead Channel Setting Pacing Amplitude: 1.5 V
Lead Channel Setting Pacing Amplitude: 2.5 V
Lead Channel Setting Pacing Pulse Width: 0.4 ms
MDC IDC MSMT LEADCHNL RA PACING THRESHOLD AMPLITUDE: 0.7 V
MDC IDC MSMT LEADCHNL RA PACING THRESHOLD PULSEWIDTH: 0.5 ms
MDC IDC MSMT LEADCHNL RA SENSING INTR AMPL: 3.6 mV
MDC IDC MSMT LEADCHNL RV IMPEDANCE VALUE: 436 Ohm
MDC IDC MSMT LEADCHNL RV PACING THRESHOLD PULSEWIDTH: 0.4 ms
MDC IDC SET LEADCHNL RV SENSING SENSITIVITY: 2.5 mV
MDC IDC STAT BRADY RV PERCENT PACED: 100 %
Zone Setting Detection Interval: 375 ms

## 2015-03-10 NOTE — Telephone Encounter (Signed)
Order has been placed.  He needs to see me in 8 weeks after he gets his machine.

## 2015-03-10 NOTE — Telephone Encounter (Signed)
See PN dated 02/18/15  Pt ready to begin CPAP therapy KC, please advise, thanks

## 2015-03-11 ENCOUNTER — Other Ambulatory Visit: Payer: Self-pay | Admitting: Pulmonary Disease

## 2015-03-11 ENCOUNTER — Telehealth: Payer: Self-pay | Admitting: Pulmonary Disease

## 2015-03-11 NOTE — Telephone Encounter (Signed)
There are several messages about this. The order has already been placed for his CPAP. A MyChart message was sent to him letting him know. I have left a message to have him call back so that we can make him aware over the phone as well.

## 2015-03-11 NOTE — Telephone Encounter (Signed)
Pt aware that the order for his CPAP has been placed as of 03/10/15 Pt advised that if he does not hear from a DME by Monday to give our office a call back.  Nothing further needed.

## 2015-03-11 NOTE — Telephone Encounter (Signed)
Pt sent a MyChart message about this message. He has been made aware that we have sent in the order and to follow up with KC in 8 weeks.

## 2015-03-22 ENCOUNTER — Telehealth: Payer: Self-pay | Admitting: Pulmonary Disease

## 2015-03-22 NOTE — Telephone Encounter (Signed)
lmtcb x1 for pt. 

## 2015-03-24 ENCOUNTER — Encounter: Payer: Self-pay | Admitting: Pulmonary Disease

## 2015-03-24 DIAGNOSIS — G4733 Obstructive sleep apnea (adult) (pediatric): Secondary | ICD-10-CM

## 2015-03-24 NOTE — Telephone Encounter (Signed)
KC please advise. Thanks. 

## 2015-03-24 NOTE — Telephone Encounter (Signed)
lmtcb x2 

## 2015-03-24 NOTE — Telephone Encounter (Signed)
Let pt know that we can turn down his pressure a little bit until he gets used to the pressure.   If he continues to have issues, may have to change to a different machine  Please send order to dme to change auto pressure to 5-12cm.

## 2015-03-25 ENCOUNTER — Encounter: Payer: Self-pay | Admitting: Pulmonary Disease

## 2015-03-25 NOTE — Telephone Encounter (Signed)
He is thinking too much. He needs to stick with current settings for at least a week and really give it a chance.  If he is still having the same issue, let us know and we can consider a different device called bilevel.

## 2015-03-25 NOTE — Telephone Encounter (Signed)
Dr Shelle Ironlance, please see patient email below -  Order placed 03/24/15 for pressure to be changed to AUTO 5-12cm Pt supposed to have this adjusted today.  ----- Message ----- FromRenato Barron: Klemz,Kunaal Sent: 03/25/2015 10:03 AM EDT To: [name],[name], MD Subject: Non-Urgent Medical Question  I assume the pressure will be reduced tonight. I slept well until after midnight and then my wife had to sleep in the den because I was making so much noise. She thinks it was the machine, but I think I was fighting (exhale) the Cpap. I would wake up and try to concentrate on trying to keep my mouth closed.   When I moved to [place] in 1971, I went to Dr. [name]  for my asthma problem, and he [name] I had "small lungs" and should lose at least 20 lbs---which was good advice. If so, could that be part of a "lung capacity" problem. My wife says "I'm thinking too much".   Brian ShinBruce Barron

## 2015-03-25 NOTE — Telephone Encounter (Signed)
lmtcb for pt.  

## 2015-03-25 NOTE — Telephone Encounter (Signed)
Spoke with patient, aware that this message has already been taken care of and his other concerns (e-mail 03/25/15) are being sent to Dr Shelle Ironlance to address.  Nothing further needed.

## 2015-04-13 ENCOUNTER — Encounter: Payer: Self-pay | Admitting: Cardiology

## 2015-04-14 ENCOUNTER — Encounter: Payer: Self-pay | Admitting: Internal Medicine

## 2015-04-22 ENCOUNTER — Encounter: Payer: Self-pay | Admitting: Internal Medicine

## 2015-05-04 ENCOUNTER — Other Ambulatory Visit: Payer: Self-pay | Admitting: Internal Medicine

## 2015-05-04 ENCOUNTER — Telehealth: Payer: Self-pay | Admitting: Internal Medicine

## 2015-05-04 NOTE — Telephone Encounter (Signed)
New Message  Pt is moving to [place] soon and wants to discuss options of doing Remote without land-line. Please call back and discuss.

## 2015-05-05 NOTE — Telephone Encounter (Signed)
Informed pt that I would have to fill out a form and fax into [employer] to order a cell adapter for pt. Informed pt that it would take 3-5 wks before he received cell adapter. Rescheduled remote transmission to 06-22-15. Pt agreed w/ this plan.

## 2015-05-31 ENCOUNTER — Other Ambulatory Visit: Payer: Self-pay

## 2015-06-15 ENCOUNTER — Telehealth: Payer: Self-pay | Admitting: Internal Medicine

## 2015-06-15 NOTE — Telephone Encounter (Signed)
Patient had to go to ED for hernia repair. [place] and would like to follow up w/ Graciela HusbandsKlein in [place] office, as this is closer for him. Informed pt that our scheduler would call him to arrange this and establish [place] with [place]. He is agreeable and thanks us for helping.

## 2015-06-15 NOTE — Telephone Encounter (Signed)
New Message   [place] and 06/10/15 went to the ED and they operated on him on 06/11/15   pt wants to see Dr. Graciela [place]lein [place] it will be a closer drive for him.   Please give pt a call, he said he is not having any pain

## 2015-08-24 ENCOUNTER — Encounter: Payer: Self-pay | Admitting: Internal Medicine

## 2015-08-24 ENCOUNTER — Ambulatory Visit (INDEPENDENT_AMBULATORY_CARE_PROVIDER_SITE_OTHER): Payer: Medicare Other | Admitting: Internal Medicine

## 2015-08-24 VITALS — BP 122/72 | HR 70 | Ht 70.0 in | Wt 182.0 lb

## 2015-08-24 DIAGNOSIS — I441 Atrioventricular block, second degree: Secondary | ICD-10-CM

## 2015-08-24 DIAGNOSIS — Z950 Presence of cardiac pacemaker: Secondary | ICD-10-CM

## 2015-08-24 DIAGNOSIS — I440 Atrioventricular block, first degree: Secondary | ICD-10-CM

## 2015-08-24 LAB — CUP PACEART INCLINIC DEVICE CHECK
Lead Channel Impedance Value: 440 Ohm
Lead Channel Impedance Value: 630 Ohm
Lead Channel Pacing Threshold Pulse Width: 0.5 ms
Lead Channel Sensing Intrinsic Amplitude: 3.9 mV
Lead Channel Setting Pacing Amplitude: 2 V
Lead Channel Setting Sensing Sensitivity: 2.5 mV
MDC IDC MSMT LEADCHNL RA PACING THRESHOLD AMPLITUDE: 0.7 V
MDC IDC MSMT LEADCHNL RV PACING THRESHOLD AMPLITUDE: 1.4 V
MDC IDC MSMT LEADCHNL RV PACING THRESHOLD PULSEWIDTH: 0.4 ms
MDC IDC SESS DTM: 20160920040000
MDC IDC SET LEADCHNL RV PACING AMPLITUDE: 1.9 V
MDC IDC SET LEADCHNL RV PACING PULSEWIDTH: 0.4 ms
MDC IDC SET ZONE DETECTION INTERVAL: 375 ms
MDC IDC STAT BRADY RV PERCENT PACED: 100 %
Pulse Gen Serial Number: 112600

## 2015-08-24 NOTE — Patient Instructions (Signed)
Medication Instructions:  Your physician recommends that you continue on your current medications as directed. Please refer to the Current Medication list given to you today.   Labwork: none  Testing/Procedures: none  Follow-Up: Your physician recommends that you find a new cardiologist near your new home. Pt states he is only 3 minutes from [facility].    Any Other Special Instructions Will Be Listed Below (If Applicable).

## 2015-08-24 NOTE — Progress Notes (Signed)
      Patient Care Team: Farris Has, MD as PCP - General (Family Medicine)   HPI  Brian Barron is a 81 y.o. 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 It was further complicated by episodes of nonsustained ventricular tachycardia with multiple morphologies. Catheterization demonstrated normal LV function and nonobstructive coronary disease. He also has sinus bradycardia.  [name] He underwent implantation with a Medtronic device 9/14. It was associated with marked improvement in exercise tolerance  Past Medical History  Diagnosis Date  . Asthma   . Mobitz type I Wenckebach atrioventricular block 07/18/2013  . First degree AV block 07/18/2013  . Polymorphic ventricular tachycardia 08/01/2013  . Pacemaker--Medtronic 12/25/2013    Past Surgical History  Procedure Laterality Date  . Permanent pacemaker insertion N/A 09/01/2013    Procedure: PERMANENT PACEMAKER INSERTION;  Surgeon: Duke Salvia, MD;  Location: [facility] CATH LAB;  Service: Cardiovascular;  Laterality: N/A;    Current Outpatient Prescriptions  Medication Sig Dispense Refill  . aspirin 81 MG tablet Take 81 mg by mouth daily.    . diphenhydrAMINE (BENADRYL) 25 MG tablet Take 25 mg by mouth at bedtime.    . Esomeprazole Magnesium (NEXIUM PO) Take by mouth daily.    . methylcellulose (ARTIFICIAL TEARS) 1 % ophthalmic solution Place 1 drop into both eyes daily as needed (dry eyes).    . naproxen sodium (ANAPROX) 220 MG tablet Take 220 mg by mouth 2 (two) times daily with a meal.    . Omega-3 Fatty Acids (FISH OIL) 645 MG CAPS Take by mouth 2 (two) times daily.    [name] rOPINIRole (REQUIP) 0.5 MG tablet 1 tablet each day after dinner. 30 tablet 6  . SYMBICORT 80-4.5 MCG/ACT inhaler INHALE 2 PUFFS INTO LUNGS TWICE DAILY 1 Inhaler 2  . traZODone (DESYREL) 50 MG tablet TAKE 1 TABLET BY MOUTH AT BEDTIME 30 tablet 0  . vitamin B-12 (CYANOCOBALAMIN) 1000 MCG tablet  Take 1,000 mcg by mouth daily.     No current facility-administered medications for this visit.    No Known Allergies  Review of Systems negative except from HPI and PMH  Physical Exam BP 122/72 mmHg  Pulse 70  Ht  (1.778 m)  Wt 182 lb (82.555 kg)  BMI 26.11 kg/m2 Well developed and well nourished in no acute distress HENT normal E scleral and icterus clear Neck Supple JVP flat; carotids brisk and full Clear to ausculation Device pocket well healed; without hematoma or erythema.  There is no tethering  r egular rate and rhythm, no murmurs gallops or rub Soft with active bowel sounds No clubbing cyanosis  Edema Alert and oriented, grossly normal motor and sensory function Skin Warm and Dry    Assessment and  Plan  Sinus Bradycardia  Pacemaker-Medtronic  The patient's device was interrogated.  The information was reviewed. No changes were made in the programming.    VT nonsustained  No intercurrent Ventricular tachycardia   has moved to [place]

## 2015-12-31 ENCOUNTER — Encounter: Payer: Self-pay | Admitting: Internal Medicine

## 2022-08-04 DEATH — deceased
